# Patient Record
Sex: Female | Born: 1980 | Hispanic: No | Marital: Married | Smoking: Never smoker
Health system: Southern US, Community
[De-identification: ages and names within clinical notes are randomized; demographics above are authoritative.]

## PROBLEM LIST (undated history)

## (undated) DIAGNOSIS — N90810 Female genital mutilation status, unspecified: Secondary | ICD-10-CM

## (undated) DIAGNOSIS — E282 Polycystic ovarian syndrome: Secondary | ICD-10-CM

## (undated) DIAGNOSIS — Z789 Other specified health status: Secondary | ICD-10-CM

## (undated) HISTORY — DX: Female genital mutilation status, unspecified: N90.810

## (undated) HISTORY — PX: CIRCUMCISION: SUR203

## (undated) HISTORY — PX: NO PAST SURGERIES: SHX2092

---

## 2006-06-10 ENCOUNTER — Ambulatory Visit: Payer: Self-pay | Admitting: Internal Medicine

## 2006-07-02 ENCOUNTER — Ambulatory Visit: Payer: Self-pay | Admitting: Family Medicine

## 2006-08-15 ENCOUNTER — Ambulatory Visit: Payer: Self-pay | Admitting: Internal Medicine

## 2006-09-02 ENCOUNTER — Encounter (INDEPENDENT_AMBULATORY_CARE_PROVIDER_SITE_OTHER): Payer: Self-pay | Admitting: Nurse Practitioner

## 2006-09-02 ENCOUNTER — Ambulatory Visit: Payer: Self-pay | Admitting: Internal Medicine

## 2007-01-19 ENCOUNTER — Inpatient Hospital Stay (HOSPITAL_COMMUNITY): Admission: AD | Admit: 2007-01-19 | Discharge: 2007-01-20 | Payer: Self-pay | Admitting: Obstetrics & Gynecology

## 2007-03-11 ENCOUNTER — Ambulatory Visit (HOSPITAL_COMMUNITY): Admission: RE | Admit: 2007-03-11 | Discharge: 2007-03-11 | Payer: Self-pay | Admitting: Obstetrics & Gynecology

## 2007-07-09 ENCOUNTER — Ambulatory Visit: Payer: Self-pay | Admitting: Advanced Practice Midwife

## 2007-07-09 ENCOUNTER — Inpatient Hospital Stay (HOSPITAL_COMMUNITY): Admission: AD | Admit: 2007-07-09 | Discharge: 2007-07-09 | Payer: Self-pay | Admitting: Obstetrics & Gynecology

## 2007-08-04 ENCOUNTER — Ambulatory Visit: Payer: Self-pay | Admitting: *Deleted

## 2007-08-04 ENCOUNTER — Inpatient Hospital Stay (HOSPITAL_COMMUNITY): Admission: AD | Admit: 2007-08-04 | Discharge: 2007-08-05 | Payer: Self-pay | Admitting: Family Medicine

## 2007-08-14 ENCOUNTER — Ambulatory Visit: Payer: Self-pay | Admitting: Obstetrics and Gynecology

## 2007-08-15 ENCOUNTER — Ambulatory Visit: Payer: Self-pay | Admitting: Gynecology

## 2007-08-15 ENCOUNTER — Inpatient Hospital Stay (HOSPITAL_COMMUNITY): Admission: AD | Admit: 2007-08-15 | Discharge: 2007-08-15 | Payer: Self-pay | Admitting: Gynecology

## 2007-08-17 ENCOUNTER — Ambulatory Visit: Payer: Self-pay | Admitting: Obstetrics & Gynecology

## 2007-08-17 ENCOUNTER — Inpatient Hospital Stay (HOSPITAL_COMMUNITY): Admission: AD | Admit: 2007-08-17 | Discharge: 2007-08-20 | Payer: Self-pay | Admitting: Obstetrics & Gynecology

## 2008-01-31 ENCOUNTER — Emergency Department (HOSPITAL_COMMUNITY): Admission: EM | Admit: 2008-01-31 | Discharge: 2008-01-31 | Payer: Self-pay | Admitting: Emergency Medicine

## 2009-10-26 ENCOUNTER — Encounter: Admission: RE | Admit: 2009-10-26 | Discharge: 2009-10-26 | Payer: Self-pay | Admitting: Family Medicine

## 2010-07-27 ENCOUNTER — Inpatient Hospital Stay (HOSPITAL_COMMUNITY): Payer: BC Managed Care – PPO

## 2010-07-27 ENCOUNTER — Inpatient Hospital Stay (HOSPITAL_COMMUNITY)
Admission: AD | Admit: 2010-07-27 | Discharge: 2010-07-27 | Disposition: A | Discharge status: Home / Self Care | Payer: BC Managed Care – PPO | Source: Ambulatory Visit | Attending: Obstetrics and Gynecology | Admitting: Obstetrics and Gynecology

## 2010-07-27 DIAGNOSIS — O211 Hyperemesis gravidarum with metabolic disturbance: Secondary | ICD-10-CM | POA: Insufficient documentation

## 2010-07-27 LAB — URINALYSIS, ROUTINE W REFLEX MICROSCOPIC
Protein, ur: NEGATIVE mg/dL
Urobilinogen, UA: 0.2 mg/dL (ref 0.0–1.0)
pH: 6 (ref 5.0–8.0)

## 2010-07-27 LAB — URINE MICROSCOPIC-ADD ON

## 2010-07-29 LAB — URINE CULTURE: Culture  Setup Time: 201206020409

## 2010-08-21 LAB — ANTIBODY SCREEN: Antibody Screen: NEGATIVE

## 2010-08-21 LAB — SYPHILIS: RPR W/REFLEX TO RPR TITER AND TREPONEMAL ANTIBODIES, TRADITIONAL SCREENING AND DIAGNOSIS ALGORITHM: RPR: NONREACTIVE

## 2010-08-21 LAB — ABO/RH: RH Type: POSITIVE

## 2010-08-21 LAB — HIV ANTIBODY (ROUTINE TESTING W REFLEX): HIV: NONREACTIVE

## 2010-08-21 LAB — HEPATITIS B SURFACE ANTIGEN: Hepatitis B Surface Ag: NEGATIVE

## 2010-09-06 ENCOUNTER — Inpatient Hospital Stay (HOSPITAL_COMMUNITY): Payer: BC Managed Care – PPO

## 2010-09-06 ENCOUNTER — Encounter (HOSPITAL_COMMUNITY): Payer: Self-pay

## 2010-09-06 ENCOUNTER — Inpatient Hospital Stay (HOSPITAL_COMMUNITY)
Admission: AD | Admit: 2010-09-06 | Discharge: 2010-09-07 | Disposition: A | Discharge status: Home / Self Care | Payer: BC Managed Care – PPO | Source: Ambulatory Visit | Attending: Obstetrics and Gynecology | Admitting: Obstetrics and Gynecology

## 2010-09-06 DIAGNOSIS — O209 Hemorrhage in early pregnancy, unspecified: Secondary | ICD-10-CM

## 2010-09-06 DIAGNOSIS — O26859 Spotting complicating pregnancy, unspecified trimester: Secondary | ICD-10-CM | POA: Insufficient documentation

## 2010-09-06 DIAGNOSIS — B3731 Acute candidiasis of vulva and vagina: Secondary | ICD-10-CM

## 2010-09-06 DIAGNOSIS — Z3490 Encounter for supervision of normal pregnancy, unspecified, unspecified trimester: Secondary | ICD-10-CM

## 2010-09-06 DIAGNOSIS — B373 Candidiasis of vulva and vagina: Secondary | ICD-10-CM

## 2010-09-06 DIAGNOSIS — O4690 Antepartum hemorrhage, unspecified, unspecified trimester: Secondary | ICD-10-CM | POA: Diagnosis present

## 2010-09-06 LAB — URINALYSIS, ROUTINE W REFLEX MICROSCOPIC
Glucose, UA: NEGATIVE mg/dL
Protein, ur: NEGATIVE mg/dL
pH: 6 (ref 5.0–8.0)

## 2010-09-06 LAB — URINE MICROSCOPIC-ADD ON

## 2010-09-06 LAB — WET PREP, GENITAL: Clue Cells Wet Prep HPF POC: NONE SEEN

## 2010-09-06 NOTE — ED Provider Notes (Signed)
Sherry Frey is a 30 y.o. female at 13.[redacted] week gestation presenting for first trimester spotting since this afternoon and exacerbation of preexisting LBP radiating down the R leg. She was seen at [facility] two days ago and was Dx with VVC Tx by Terazol 7. She also has a first trimester screen [place] w/ no abnormalities noted. Last IC 5 days ago. History OB History    Grav Para Term Preterm Abortions TAB SAB Ect Mult Living   2 1 1       1      History reviewed. No pertinent past medical history. Past Surgical History  Procedure Date  . Circumcision     female circumcision @ age 81 yrs old   Family History: family history is not on file. Social History:  reports that she has never smoked. She does not have any smokeless tobacco history on file. She reports that she does not drink alcohol or use illicit drugs.  ROS    Blood pressure 100/62, pulse 80, temperature 99 F (37.2 C), temperature source Oral, resp. rate 16, height 6' 4.25" (1.937 m), weight 89.869 kg (198 lb 2 oz). Exam Physical Exam  Prenatal labs: ABO, Rh:  A + Antibody:  neg Rubella:  Immune RPR:   NR HBsAg:   neg HIV:   NR GBS:     Assessment/Plan: 1 13.1 week IUP 2. First trimester spotting of unknown etiology, stable  Plan: 1. Wet Prep, GC/CT 2. OB Ultrasound  < 14 weeks   SMITH,[place] 09/06/2010, 7:26 PM

## 2010-09-06 NOTE — Progress Notes (Signed)
Pt states vaginal bleeding began today at 1600, having lower back pain only, rates 10/10. Denies vag d/c changes, blood is dark and mucusy.

## 2010-09-06 NOTE — Progress Notes (Signed)
"  Around 1600 I was riding with my husband and started bleeding.  I had been having the lower back pain and RT leg pain since this morning.  I am having no abd pain at this time."

## 2010-09-07 NOTE — ED Provider Notes (Signed)
No new VB.  Wet prep, genital       Yeast, Wet Prep MODERATE    Trich, Wet Prep NONE SEEN   Clue Cells, Wet Prep NONE SEEN    WBC, Wet Prep MODERATE BACTERIA SEEN        *RADIOLOGY REPORT*   Clinical Data: Vaginal bleeding.  Pregnancy.   OBSTETRIC <14 WK ULTRASOUND   Technique:  Transabdominal ultrasound was performed for evaluation of the gestation as well as the maternal uterus and adnexal regions.   Comparison:  None.   Intrauterine gestational sac: Single intrauterine gestational sac. Yolk sac: Not visualized. Embryo: Present. Cardiac Activity: Present. Heart Rate: 147 bpm   MSD:  mm  w  d CRL:  75 mm  13w  5d          [place] EDC: 03/09/2011   Maternal uterus/Adnexae: Physiologic appearance of the uterus and adnexa.  No subchorionic hemorrhage or free fluid.   IMPRESSION: Single viable intrauterine pregnancy with estimated gestational age [redacted] weeks 5 days.  No complicating features.   Original Report Authenticated By: Andreas Newport, M.D.     Imaging     US OB Comp Less 14 Wks (Order #[id number]) on 09/06/2010 - Imaging Information       Signed Date/Time   Phone Pager    Wynn Banker 09/06/2010  9:14 PM EDT [phone]     Assessment: 1. First trimester bleeding secondary to yeast infection (on day #2 of Tx), stable  Plan: 1/ D/C home 2 F/U AS at [facility] 3. Pelvic rest x 1 week 4. Complete course of Terazol 7 5. SAB precautions  Dorathy Kinsman, CNM

## 2010-09-09 LAB — GC/CHLAMYDIA PROBE AMP, GENITAL: GC Probe Amp, Genital: NEGATIVE

## 2010-11-22 LAB — CBC
HCT: 32.2 — ABNORMAL LOW
Hemoglobin: 11.1 — ABNORMAL LOW
Hemoglobin: 12.8
MCHC: 34.3
MCHC: 34.1
MCV: 82
Platelets: 277
RBC: 3.93
RBC: 4.6
RDW: 13.9
WBC: 6.2
WBC: 6.3

## 2010-11-22 LAB — COMPREHENSIVE METABOLIC PANEL
ALT: 13
Alkaline Phosphatase: 160 — ABNORMAL HIGH
Chloride: 108
Sodium: 135

## 2010-11-22 LAB — URINALYSIS, ROUTINE W REFLEX MICROSCOPIC: pH: 6

## 2010-11-30 LAB — DIFFERENTIAL
Basophils Relative: 1 % (ref 0–1)
Eosinophils Absolute: 0.1 10*3/uL (ref 0.0–0.7)
Lymphs Abs: 2.3 10*3/uL (ref 0.7–4.0)
Monocytes Absolute: 0.8 10*3/uL (ref 0.1–1.0)
Monocytes Relative: 13 % — ABNORMAL HIGH (ref 3–12)
Neutrophils Relative %: 47 % (ref 43–77)

## 2010-11-30 LAB — POCT I-STAT, CHEM 8
Calcium, Ion: 1.2 mmol/L (ref 1.12–1.32)
Creatinine, Ser: 0.6 mg/dL (ref 0.4–1.2)
Glucose, Bld: 73 mg/dL (ref 70–99)
HCT: 42 % (ref 36.0–46.0)
Hemoglobin: 14.3 g/dL (ref 12.0–15.0)
Potassium: 3.9 mEq/L (ref 3.5–5.1)

## 2010-11-30 LAB — URINE MICROSCOPIC-ADD ON

## 2010-11-30 LAB — URINALYSIS, ROUTINE W REFLEX MICROSCOPIC
Glucose, UA: NEGATIVE mg/dL
Leukocytes, UA: NEGATIVE
pH: 5.5 (ref 5.0–8.0)

## 2010-11-30 LAB — POCT PREGNANCY, URINE: Preg Test, Ur: NEGATIVE

## 2010-11-30 LAB — CBC: RDW: 13.2 % (ref 11.5–15.5)

## 2010-12-04 LAB — WET PREP, GENITAL
Clue Cells Wet Prep HPF POC: NONE SEEN
Trich, Wet Prep: NONE SEEN

## 2010-12-04 LAB — URINALYSIS, ROUTINE W REFLEX MICROSCOPIC
Bilirubin Urine: NEGATIVE
Hgb urine dipstick: NEGATIVE
Ketones, ur: NEGATIVE
Specific Gravity, Urine: 1.02
Urobilinogen, UA: 0.2
pH: 6

## 2010-12-04 LAB — DIFFERENTIAL
Basophils Absolute: 0
Basophils Relative: 0
Lymphocytes Relative: 42
Neutro Abs: 3.4
Neutrophils Relative %: 48

## 2010-12-04 LAB — CBC
MCHC: 34.7
RDW: 13.6

## 2010-12-04 LAB — GC/CHLAMYDIA PROBE AMP, GENITAL
Chlamydia, DNA Probe: NEGATIVE
GC Probe Amp, Genital: NEGATIVE

## 2011-02-26 NOTE — L&D Delivery Note (Signed)
Delivery Note  Called by RN to come for delivery, upon putting pt in stirrups vertex was crowning spontaneously, FHR variables were noted, overall reassuring At 4:35 AM a viable female was delivered via Vaginal, Spontaneous Delivery (Presentation: Left Occiput Anterior). Loose nuchal cord reduced x2, shoulders delivered easily  APGAR: 9, 9; weight 7 lb 9.3 oz (3440 g).   Placenta status: Intact, Retained, upon delivery of placenta, clots expelled, bimanual exploration revealed possible placenta retained, attempted manual removal, Fundus remained firm and was 2-3 above U and deviated to R side. Attempted to repair shallow vaginal laceration, tissue very friable. Called Dr. Estanislado Pandy to come evaluate for possible retained placenta, vitals remained stable, bleeding was stable, foley was in place and draining, epidural working well,  See Dr. Cloretta Ned note Pathology.  Cord: 3 vessels with the following complications: Manual removal/retained placenta    Anesthesia: Epidural  Episiotomy: None Lacerations: 1st degree;Perineal Suture Repair: 3.0 monocryl Est. Blood Loss (mL): 300  Mom to postpartum.  Baby to nursery-stable. Plans to Breast and bottle feed   Baylyn Sickles M 03/05/2011, 6:28 AM

## 2011-03-04 ENCOUNTER — Inpatient Hospital Stay (HOSPITAL_COMMUNITY)
Admission: AD | Admit: 2011-03-04 | Discharge: 2011-03-07 | DRG: 372 | Disposition: A | Discharge status: Home / Self Care | Payer: BC Managed Care – PPO | Source: Ambulatory Visit | Attending: Obstetrics and Gynecology | Admitting: Obstetrics and Gynecology

## 2011-03-04 ENCOUNTER — Inpatient Hospital Stay (HOSPITAL_COMMUNITY): Payer: BC Managed Care – PPO | Admitting: Anesthesiology

## 2011-03-04 ENCOUNTER — Encounter (HOSPITAL_COMMUNITY): Payer: Self-pay | Admitting: *Deleted

## 2011-03-04 ENCOUNTER — Encounter (HOSPITAL_COMMUNITY): Payer: Self-pay | Admitting: Anesthesiology

## 2011-03-04 DIAGNOSIS — N979 Female infertility, unspecified: Secondary | ICD-10-CM

## 2011-03-04 DIAGNOSIS — O4290 Premature rupture of membranes, unspecified as to length of time between rupture and onset of labor, unspecified weeks of gestation: Secondary | ICD-10-CM | POA: Diagnosis present

## 2011-03-04 DIAGNOSIS — E282 Polycystic ovarian syndrome: Secondary | ICD-10-CM | POA: Diagnosis present

## 2011-03-04 DIAGNOSIS — D649 Anemia, unspecified: Secondary | ICD-10-CM | POA: Diagnosis present

## 2011-03-04 DIAGNOSIS — IMO0002 Reserved for concepts with insufficient information to code with codable children: Secondary | ICD-10-CM

## 2011-03-04 DIAGNOSIS — N90810 Female genital mutilation status, unspecified: Secondary | ICD-10-CM | POA: Diagnosis present

## 2011-03-04 DIAGNOSIS — O4690 Antepartum hemorrhage, unspecified, unspecified trimester: Secondary | ICD-10-CM

## 2011-03-04 DIAGNOSIS — O9903 Anemia complicating the puerperium: Secondary | ICD-10-CM | POA: Diagnosis not present

## 2011-03-04 HISTORY — DX: Other specified health status: Z78.9

## 2011-03-04 LAB — CBC
MCH: 26.2 pg (ref 26.0–34.0)
MCHC: 32.5 g/dL (ref 30.0–36.0)
Platelets: 267 10*3/uL (ref 150–400)

## 2011-03-04 LAB — POCT FERN TEST: Fern Test: POSITIVE

## 2011-03-04 MED ORDER — LACTATED RINGERS IV SOLN
INTRAVENOUS | Status: DC
  Administered 2011-03-04: 22:00:00 via INTRAVENOUS

## 2011-03-04 MED ORDER — OXYTOCIN 20 UNITS IN LACTATED RINGERS INFUSION - SIMPLE
1.0000 m[IU]/min | INTRAVENOUS | Status: DC
  Administered 2011-03-04: 1 m[IU]/min via INTRAVENOUS
  Filled 2011-03-04: qty 1000

## 2011-03-04 MED ORDER — OXYTOCIN BOLUS FROM INFUSION
500.0000 mL | Freq: Once | INTRAVENOUS | Status: AC
  Administered 2011-03-05: 500 mL via INTRAVENOUS
  Filled 2011-03-04: qty 500
  Filled 2011-03-04: qty 1000

## 2011-03-04 MED ORDER — IBUPROFEN 600 MG PO TABS: 600.0000 mg | ORAL_TABLET | Freq: Four times a day (QID) | ORAL | Status: DC | PRN

## 2011-03-04 MED ORDER — OXYCODONE-ACETAMINOPHEN 5-325 MG PO TABS: 2.0000 | ORAL_TABLET | ORAL | Status: DC | PRN

## 2011-03-04 MED ORDER — EPHEDRINE 5 MG/ML INJ: 10.0000 mg | INTRAVENOUS | Status: DC | PRN

## 2011-03-04 MED ORDER — DIPHENHYDRAMINE HCL 50 MG/ML IJ SOLN: 12.5000 mg | INTRAMUSCULAR | Status: DC | PRN

## 2011-03-04 MED ORDER — EPHEDRINE 5 MG/ML INJ
10.0000 mg | INTRAVENOUS | Status: DC | PRN
  Filled 2011-03-04: qty 4

## 2011-03-04 MED ORDER — LIDOCAINE HCL (PF) 1 % IJ SOLN
30.0000 mL | INTRAMUSCULAR | Status: DC | PRN
  Filled 2011-03-04: qty 30

## 2011-03-04 MED ORDER — PHENYLEPHRINE 40 MCG/ML (10ML) SYRINGE FOR IV PUSH (FOR BLOOD PRESSURE SUPPORT): 80.0000 ug | PREFILLED_SYRINGE | INTRAVENOUS | Status: DC | PRN

## 2011-03-04 MED ORDER — TERBUTALINE SULFATE 1 MG/ML IJ SOLN: 0.2500 mg | Freq: Once | INTRAMUSCULAR | Status: AC | PRN

## 2011-03-04 MED ORDER — BUTORPHANOL TARTRATE 2 MG/ML IJ SOLN: 2.0000 mg | INTRAMUSCULAR | Status: DC | PRN

## 2011-03-04 MED ORDER — ACETAMINOPHEN 325 MG PO TABS: 650.0000 mg | ORAL_TABLET | ORAL | Status: DC | PRN

## 2011-03-04 MED ORDER — FENTANYL 2.5 MCG/ML BUPIVACAINE 1/10 % EPIDURAL INFUSION (WH - ANES)
14.0000 mL/h | INTRAMUSCULAR | Status: DC
  Administered 2011-03-05 (×2): 14 mL/h via EPIDURAL
  Filled 2011-03-04 (×2): qty 60

## 2011-03-04 MED ORDER — OXYTOCIN 20 UNITS IN LACTATED RINGERS INFUSION - SIMPLE: 125.0000 mL/h | Freq: Once | INTRAVENOUS | Status: DC

## 2011-03-04 MED ORDER — CITRIC ACID-SODIUM CITRATE 334-500 MG/5ML PO SOLN: 30.0000 mL | ORAL | Status: DC | PRN

## 2011-03-04 MED ORDER — PHENYLEPHRINE 40 MCG/ML (10ML) SYRINGE FOR IV PUSH (FOR BLOOD PRESSURE SUPPORT)
80.0000 ug | PREFILLED_SYRINGE | INTRAVENOUS | Status: DC | PRN
  Filled 2011-03-04: qty 5

## 2011-03-04 MED ORDER — LACTATED RINGERS IV SOLN: 500.0000 mL | Freq: Once | INTRAVENOUS | Status: DC

## 2011-03-04 MED ORDER — ONDANSETRON HCL 4 MG/2ML IJ SOLN: 4.0000 mg | Freq: Four times a day (QID) | INTRAMUSCULAR | Status: DC | PRN

## 2011-03-04 MED ORDER — LACTATED RINGERS IV SOLN: 500.0000 mL | INTRAVENOUS | Status: DC | PRN

## 2011-03-04 MED ORDER — OXYTOCIN 10 UNIT/ML IJ SOLN: 10.0000 [IU] | Freq: Once | INTRAMUSCULAR | Status: DC

## 2011-03-04 NOTE — Progress Notes (Signed)
Pt in for lower back pain that has progressively gotten worse since yesterday.  Reports increased pelvic pressure since yesterday.  Reports a watery discharge, states its been going on x3 weeks but heavier today.  Denies any bleeding.  + FM.

## 2011-03-04 NOTE — Anesthesia Procedure Notes (Signed)
Epidural Patient location during procedure: OB Start time: 03/04/2011 11:52 PM Reason for block: procedure for pain  Staffing Performed by: anesthesiologist   Preanesthetic Checklist Completed: patient identified, site marked, surgical consent, pre-op evaluation, timeout performed, IV checked, risks and benefits discussed and monitors and equipment checked  Epidural Patient position: sitting Prep: site prepped and draped and DuraPrep Patient monitoring: continuous pulse ox and blood pressure Approach: midline Injection technique: LOR air  Needle:  Needle type: Tuohy  Needle gauge: 17 G Needle length: 9 cm Needle insertion depth: 5 cm cm Catheter type: closed end flexible Catheter size: 19 Gauge Catheter at skin depth: 10 cm Test dose: negative  Assessment Events: blood not aspirated, injection not painful, no injection resistance, negative IV test and no paresthesia  Additional Notes Discussed risk of headache, infection, bleeding, nerve injury and failed or incomplete block.  Patient voices understanding and wishes to proceed.

## 2011-03-04 NOTE — Anesthesia Preprocedure Evaluation (Signed)
Anesthesia Evaluation  Patient identified by MRN, date of birth, ID band Patient awake    Reviewed: Allergy & Precautions, H&P , NPO status , Patient's Chart, lab work & pertinent test results, reviewed documented beta blocker date and time   History of Anesthesia Complications Negative for: history of anesthetic complications  Airway Mallampati: II TM Distance: >3 FB Neck ROM: full    Dental  (+) Teeth Intact   Pulmonary neg pulmonary ROS,  clear to auscultation        Cardiovascular neg cardio ROS regular Normal    Neuro/Psych Negative Neurological ROS  Negative Psych ROS   GI/Hepatic negative GI ROS, Neg liver ROS,   Endo/Other  Negative Endocrine ROS  Renal/GU negative Renal ROS  Genitourinary negative   Musculoskeletal   Abdominal   Peds  Hematology negative hematology ROS (+)   Anesthesia Other Findings   Reproductive/Obstetrics (+) Pregnancy                           Anesthesia Physical Anesthesia Plan  ASA: II  Anesthesia Plan: Epidural   Post-op Pain Management:    Induction:   Airway Management Planned:   Additional Equipment:   Intra-op Plan:   Post-operative Plan:   Informed Consent: I have reviewed the patients History and Physical, chart, labs and discussed the procedure including the risks, benefits and alternatives for the proposed anesthesia with the patient or authorized representative who has indicated his/her understanding and acceptance.     Plan Discussed with:   Anesthesia Plan Comments:         Anesthesia Quick Evaluation  

## 2011-03-05 ENCOUNTER — Encounter (HOSPITAL_COMMUNITY): Payer: Self-pay | Admitting: *Deleted

## 2011-03-05 ENCOUNTER — Other Ambulatory Visit: Payer: Self-pay | Admitting: Obstetrics and Gynecology

## 2011-03-05 DIAGNOSIS — N979 Female infertility, unspecified: Secondary | ICD-10-CM

## 2011-03-05 DIAGNOSIS — E282 Polycystic ovarian syndrome: Secondary | ICD-10-CM | POA: Diagnosis present

## 2011-03-05 LAB — CBC
HCT: 28.9 % — ABNORMAL LOW (ref 36.0–46.0)
Hemoglobin: 9.5 g/dL — ABNORMAL LOW (ref 12.0–15.0)
MCH: 26.8 pg (ref 26.0–34.0)
MCHC: 32.9 g/dL (ref 30.0–36.0)
MCV: 81.4 fL (ref 78.0–100.0)

## 2011-03-05 LAB — RPR: RPR Ser Ql: NONREACTIVE

## 2011-03-05 MED ORDER — ONDANSETRON HCL 4 MG PO TABS: 4.0000 mg | ORAL_TABLET | ORAL | Status: DC | PRN

## 2011-03-05 MED ORDER — SIMETHICONE 80 MG PO CHEW
80.0000 mg | CHEWABLE_TABLET | ORAL | Status: DC | PRN
  Administered 2011-03-07: 80 mg via ORAL

## 2011-03-05 MED ORDER — METHYLERGONOVINE MALEATE 0.2 MG PO TABS
0.2000 mg | ORAL_TABLET | ORAL | Status: DC
  Administered 2011-03-05 – 2011-03-06 (×6): 0.2 mg via ORAL
  Filled 2011-03-05 (×6): qty 1

## 2011-03-05 MED ORDER — IBUPROFEN 600 MG PO TABS
600.0000 mg | ORAL_TABLET | Freq: Four times a day (QID) | ORAL | Status: DC
  Administered 2011-03-05 – 2011-03-07 (×8): 600 mg via ORAL
  Filled 2011-03-05 (×8): qty 1

## 2011-03-05 MED ORDER — DIPHENHYDRAMINE HCL 25 MG PO CAPS
25.0000 mg | ORAL_CAPSULE | Freq: Once | ORAL | Status: AC
  Administered 2011-03-05: 25 mg via ORAL
  Filled 2011-03-05: qty 1

## 2011-03-05 MED ORDER — WITCH HAZEL-GLYCERIN EX PADS
1.0000 "application " | MEDICATED_PAD | CUTANEOUS | Status: DC | PRN
  Administered 2011-03-06: 1 via TOPICAL

## 2011-03-05 MED ORDER — POLYSACCHARIDE IRON 150 MG PO CAPS
150.0000 mg | ORAL_CAPSULE | Freq: Two times a day (BID) | ORAL | Status: DC
  Administered 2011-03-05 (×2): 150 mg via ORAL
  Filled 2011-03-05 (×3): qty 1

## 2011-03-05 MED ORDER — LANOLIN HYDROUS EX OINT: TOPICAL_OINTMENT | CUTANEOUS | Status: DC | PRN

## 2011-03-05 MED ORDER — ONDANSETRON HCL 4 MG/2ML IJ SOLN: 4.0000 mg | INTRAMUSCULAR | Status: DC | PRN

## 2011-03-05 MED ORDER — MEASLES, MUMPS & RUBELLA VAC ~~LOC~~ INJ
0.5000 mL | INJECTION | Freq: Once | SUBCUTANEOUS | Status: DC
  Filled 2011-03-05: qty 0.5

## 2011-03-05 MED ORDER — DIBUCAINE 1 % RE OINT
1.0000 "application " | TOPICAL_OINTMENT | RECTAL | Status: DC | PRN
  Administered 2011-03-06: 1 via RECTAL
  Filled 2011-03-05: qty 28

## 2011-03-05 MED ORDER — DIPHENHYDRAMINE HCL 25 MG PO CAPS: 25.0000 mg | ORAL_CAPSULE | Freq: Four times a day (QID) | ORAL | Status: DC | PRN

## 2011-03-05 MED ORDER — MISOPROSTOL 200 MCG PO TABS
ORAL_TABLET | ORAL | Status: AC
  Filled 2011-03-05: qty 5

## 2011-03-05 MED ORDER — BENZOCAINE-MENTHOL 20-0.5 % EX AERO
INHALATION_SPRAY | CUTANEOUS | Status: AC
  Filled 2011-03-05: qty 56

## 2011-03-05 MED ORDER — TETANUS-DIPHTH-ACELL PERTUSSIS 5-2.5-18.5 LF-MCG/0.5 IM SUSP
0.5000 mL | Freq: Once | INTRAMUSCULAR | Status: AC
  Administered 2011-03-06: 0.5 mL via INTRAMUSCULAR
  Filled 2011-03-05: qty 0.5

## 2011-03-05 MED ORDER — DTAP-HEPATITIS B RECOMB-IPV IM SUSP: 0.5000 mL | Freq: Once | INTRAMUSCULAR | Status: DC

## 2011-03-05 MED ORDER — BENZOCAINE-MENTHOL 20-0.5 % EX AERO
1.0000 "application " | INHALATION_SPRAY | CUTANEOUS | Status: DC | PRN
  Administered 2011-03-05: 1 via TOPICAL

## 2011-03-05 MED ORDER — ZOLPIDEM TARTRATE 5 MG PO TABS: 5.0000 mg | ORAL_TABLET | Freq: Every evening | ORAL | Status: DC | PRN

## 2011-03-05 MED ORDER — OXYCODONE-ACETAMINOPHEN 5-325 MG PO TABS
1.0000 | ORAL_TABLET | ORAL | Status: DC | PRN
  Administered 2011-03-05 – 2011-03-07 (×4): 1 via ORAL
  Filled 2011-03-05 (×4): qty 1

## 2011-03-05 MED ORDER — PRENATAL MULTIVITAMIN CH
1.0000 | ORAL_TABLET | Freq: Every day | ORAL | Status: DC
  Administered 2011-03-06 – 2011-03-07 (×2): 1 via ORAL
  Filled 2011-03-05 (×2): qty 1

## 2011-03-05 MED ORDER — LIDOCAINE HCL 1.5 % IJ SOLN
INTRAMUSCULAR | Status: DC | PRN
  Administered 2011-03-04: 4 mL via INTRADERMAL
  Administered 2011-03-04: 3 mL via INTRADERMAL
  Administered 2011-03-05: 4 mL via INTRADERMAL

## 2011-03-05 MED ORDER — SENNOSIDES-DOCUSATE SODIUM 8.6-50 MG PO TABS
2.0000 | ORAL_TABLET | Freq: Every day | ORAL | Status: DC
  Administered 2011-03-05 – 2011-03-06 (×2): 2 via ORAL

## 2011-03-05 NOTE — Anesthesia Postprocedure Evaluation (Signed)
Anesthesia Post Note  Patient: Sherry Frey  Procedure(s) Performed: * No procedures listed *  Anesthesia type: Epidural  Patient location: Mother/Baby  Post pain: Pain level controlled  Post assessment: Post-op Vital signs reviewed  Last Vitals:  Filed Vitals:   03/05/11 1104  BP: 99/67  Pulse: 87  Temp:   Resp: 18    Post vital signs: Reviewed  Level of consciousness: awake  Complications: No apparent anesthesia complications

## 2011-03-05 NOTE — Progress Notes (Signed)
Called to evaluate possible retained placenta. SVD of girl without complications. Placenta spontaneously expelled. No excessive bleeding. Upon my arrival, patient calm and comfortable under epidural. Placenta examined and appeared complete. Manual inspection of uterine cavity returns a few clots but o/w unremarkable. Uterus firm 0/0 slightly deviated to left. Superficial vaginal laceration repaired with 3-0 Monocryl. Pt informed that we will monitor closely.
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
## 2011-03-05 NOTE — Progress Notes (Signed)
Patient ID: Sherry Frey, female   DOB: 05-28-1980, 31 y.o.   MRN: [id number] .Subjective: Feels some pressure, but denies ctx pain   Objective: BP 82/44  Pulse 79  Temp(Src) 97.9 F (36.6 C) (Oral)  Resp 20  Ht 5\' 5"  (1.651 m)  Wt 87.998 kg (194 lb)  BMI 32.28 kg/m2  LMP 06/06/2010  Breastfeeding? Unknown   FHT:  FHR: 110 bpm, variability: moderate,  accelerations:  Present,  decelerations:  Present early variables UC:   regular, every 2-3 minutes - MVU about 220 SVE:   Dilation: 6.5 Effacement (%): 100 Station: -2 Exam by:: Isac Sarna, RN, Bonnielee Haff, RN  Pitocin at 3mu  Assessment / Plan: Augmentation of labor, progressing well GBS neg  Fetal Wellbeing:  Category I Pain Control:  Epidural  Recheck about 2 hours or PRN  Update physician PRN  Fantasy Donald M 03/05/2011, 4:02 AM

## 2011-03-05 NOTE — Progress Notes (Signed)
Addendum to delivery note.   THere were significant trailing membranes as placenta delivered, removed with kelly clamp.  Placenta was sent to path for eval.  S.Kinser Fellman, CNM

## 2011-03-05 NOTE — Progress Notes (Signed)
Patient ID: Sherry Frey, female   DOB: 10/30/1980, 31 y.o.   MRN: [id number] .Subjective: Comfortable now with epidural   Objective: BP 82/49  Pulse 108  Temp(Src) 97.6 F (36.4 C) (Oral)  Resp 18  Ht 5\' 5"  (1.651 m)  Wt 87.998 kg (194 lb)  BMI 32.28 kg/m2  LMP 06/06/2010  Breastfeeding? Unknown   FHT:  FHR: 110 bpm, variability: moderate,  accelerations:  Present,  decelerations:  Absent UC:   regular, every 2-3 minutes SVE:   Dilation: 4 Effacement (%): 60 Station: -2 Exam by:: S. Aara Jacquot, CNM  Pitocin at 4mu  Assessment / Plan: IOL secondary to PROM, minimal cervical change GBS neg IUPC placed initial large bloody show, replaced with 2nd IUPC without difficulty  Will augment pitocin for adequate MVU's   Fetal Wellbeing:  Category I Pain Control:  Epidural  Update physician PRN  Arzella Rehmann M 03/05/2011, 1:00 AM

## 2011-03-05 NOTE — Progress Notes (Signed)
Post Partum Day 0 Subjective: Called to assess pt's VB around 0845, but unable to get to room until after 1000.  Pt's fundus 4/u, but bleeding stable.  Pt had c/o dizziness and nausea earlier, but none now.  Female visitor at bedside.  Nurse and I assisted pt to BR and ambulated okay w/o dizziness.  Able to void.  Newborn was initially in nursery on my arrival to bedside, but returned to pt's room while I was there.  Objective: Blood pressure 99/67, pulse 87, temperature 98.2 F (36.8 C), temperature source Oral, resp. rate 18, height 5\' 5"  (1.651 m), weight 87.998 kg (194 lb), last menstrual period 06/06/2010, unknown if currently breastfeeding. .. Filed Vitals:   03/05/11 0900 03/05/11 1100 03/05/11 1102 03/05/11 1104  BP: 86/61 98/66 108/60 99/67  Pulse: 79 109 139 87  Temp: 98.2 F (36.8 C)     TempSrc: Oral     Resp: 18 20 20 18   Height:      Weight:       Physical Exam:  General: alert, cooperative and no distress Lochia: appropriate Uterine Fundus: firm, but 4/U Incision: n/a  Basename 03/05/11 1100 03/04/11 2216  HGB 9.5* 10.6*  HCT 28.9* 32.6*    Assessment/Plan: 1.  PPD#0--questionable retained products post-delivery w/ trailing membranes but normal bimanual exam by [facility] and Dr. Estanislado Pandy 2.  Moderate to heavy VB since trx to M/B, but stable 3.  Fundus above umbilicus--question if bladder retention or fibroid  4.  Orthostatic between lying and sitting  5.  Up OOB w/ assistance and tolerated well x1 this AM 6.  HGB only decreased so far by 1 point, but started on Nu-iron bid and will recheck CBC tomorrow or prn 7.  Cont current POC and close monitoring  LOS: 1 day   Delphin Funes H 03/05/2011, 12:50 PM

## 2011-03-06 LAB — CBC
MCH: 27.2 pg (ref 26.0–34.0)
MCHC: 33.1 g/dL (ref 30.0–36.0)
MCV: 82.1 fL (ref 78.0–100.0)
Platelets: 230 10*3/uL (ref 150–400)
RBC: 3.46 MIL/uL — ABNORMAL LOW (ref 3.87–5.11)

## 2011-03-06 MED ORDER — POLYSACCHARIDE IRON 150 MG PO CAPS
150.0000 mg | ORAL_CAPSULE | Freq: Every day | ORAL | Status: DC
  Administered 2011-03-06 – 2011-03-07 (×2): 150 mg via ORAL
  Filled 2011-03-06 (×3): qty 1

## 2011-03-06 NOTE — Progress Notes (Addendum)
Post Partum Day 1 Subjective: no complaints.  Up ad lib, no syncope or dizziness.  On Methergine course po.  Plans breast and bottlefeeding.  Objective: Blood pressure 94/69, pulse 121, temperature 98.2 F (36.8 C), temperature source Oral, resp. rate 20, height 5\' 5"  (1.651 m), weight 87.998 kg (194 lb), last menstrual period 06/06/2010, SpO2 100.00%, unknown if currently breastfeeding.  Filed Vitals:   03/05/11 1104 03/05/11 1400 03/05/11 2103 03/06/11 0506  BP: 99/67 93/64 93/60  94/69  Pulse: 87 85 96 121  Temp:  98.2 F (36.8 C) 98.3 F (36.8 C) 98.2 F (36.8 C)  TempSrc:  Oral Oral Oral  Resp: 18 20 18 20   Height:      Weight:      SpO2:  98% 100%     Physical Exam:  General: alert Lochia: appropriate Uterine Fundus: firm, 2 below umbilicus, no clots expressed Incision: healing well DVT Evaluation: No evidence of DVT seen on physical exam. Negative Homan's sign.   Basename 03/06/11 0540 03/05/11 1100  HGB 9.4* 9.5*  HCT 28.4* 28.9*    Assessment/Plan: Anemia, but hemodynamic stability Mild tachycardia with exertion, but patient tolerating without difficulty  Check orthostatics today FeSO4 Plan for discharge tomorrow    LOS: 2 days   Estha Few 03/06/2011, 8:30 AM

## 2011-03-07 DIAGNOSIS — D649 Anemia, unspecified: Secondary | ICD-10-CM | POA: Diagnosis present

## 2011-03-07 MED ORDER — IBUPROFEN 600 MG PO TABS: 600.0000 mg | ORAL_TABLET | Freq: Four times a day (QID) | ORAL | Status: AC | PRN

## 2011-03-07 MED ORDER — INFLUENZA VIRUS VACC SPLIT PF IM SUSP
0.2500 mL | INTRAMUSCULAR | Status: DC
  Filled 2011-03-07: qty 0.25

## 2011-03-07 MED ORDER — OXYCODONE-ACETAMINOPHEN 5-325 MG PO TABS: 1.0000 | ORAL_TABLET | ORAL | Status: AC | PRN

## 2011-03-07 NOTE — Discharge Summary (Signed)
  Obstetric Discharge Summary Reason for Admission: onset of labor and rupture of membranes Prenatal Procedures: NST and ultrasound Intrapartum Procedures: spontaneous vaginal delivery and uterine exploration Postpartum Procedures: none Complications-Operative and Postpartum: 1st degree perineal laceration  Temp:  [97.7 F (36.5 C)-98.4 F (36.9 C)] 98.4 F (36.9 C) (01/10 0535) Pulse Rate:  [77-85] 85  (01/10 0535) Resp:  [20] 20  (01/10 0535) BP: (90-107)/(60-69) 95/63 mmHg (01/10 0535) SpO2:  [100 %] 100 % (01/10 0046) Hemoglobin  Date Value Range Status  03/06/2011 9.4* 12.0-15.0 (g/dL) Final     HCT  Date Value Range Status  03/06/2011 28.4* 36.0-46.0 (%) Final    Hospital Course:  Hospital Course: Admitted in labor 03/04/11 with SROM. Negative GBS. Progressed to fully dilated with augmentation. Delivery was performed by Sanda Klein, CNM, without difficulty, but she was unsure if all placental fragments had been removed.  Manual exploration was performed, and Dr. Estanislado Pandy was consulted, with Dr. Estanislado Pandy performing an examination as well, with no retained fragments noted.  Patient and baby tolerated the procedure without difficulty, with a 1st degree perineal laceration noted. Infant to FTN. Mother and infant then had an uncomplicated postpartum course, with breast and bottle feeding going well. Mom's physical exam was WNL, and she was discharged home in stable condition. Contraception plan was undecided at time of discharge.  She received adequate benefit from po pain medications, and was using both Motrin and Percocet.  She will continue her iron supplement after discharge.  Discharge Diagnoses: Term Pregnancy-delivered and anemia, stable  Discharge Information: Date: 03/07/2011 Activity: Per [facility] handout Diet: routine Medications:  Medication List  As of 03/07/2011  8:56 AM   START taking these medications         ibuprofen 600 MG tablet   Commonly known as: ADVIL,MOTRIN   Take  1 tablet (600 mg total) by mouth every 6 (six) hours as needed for pain.      oxyCODONE-acetaminophen 5-325 MG per tablet   Commonly known as: PERCOCET   Take 1-2 tablets by mouth every 3 (three) hours as needed (moderate - severe pain).         CONTINUE taking these medications         acetaminophen 325 MG tablet   Commonly known as: TYLENOL      IRON PO      ondansetron 8 MG disintegrating tablet   Commonly known as: ZOFRAN-ODT      prenatal vitamin w/FE, FA 27-1 MG Tabs          Where to get your medications    These are the prescriptions that you need to pick up.   You may get these medications from any pharmacy.         ibuprofen 600 MG tablet   oxyCODONE-acetaminophen 5-325 MG per tablet           Condition: stable Instructions: refer to practice specific booklet Discharge to: home Follow-up Information    Follow up with [facility] in 6 weeks. (Call for any questions or concerns)          Newborn Data: Live born  Information for the patient's newborn:  Yamna, Mackel [[id number]]  female ; APGAR 9/9 , ; weight 7+9 ;  Home with mother.  Nigel Bridgeman 03/07/2011, 8:56 AM

## 2011-05-16 ENCOUNTER — Encounter (INDEPENDENT_AMBULATORY_CARE_PROVIDER_SITE_OTHER): Payer: BC Managed Care – PPO | Admitting: Obstetrics and Gynecology

## 2011-05-16 DIAGNOSIS — N912 Amenorrhea, unspecified: Secondary | ICD-10-CM

## 2011-05-16 DIAGNOSIS — Z30017 Encounter for initial prescription of implantable subdermal contraceptive: Secondary | ICD-10-CM

## 2011-05-27 ENCOUNTER — Encounter (INDEPENDENT_AMBULATORY_CARE_PROVIDER_SITE_OTHER): Payer: BC Managed Care – PPO | Admitting: Obstetrics and Gynecology

## 2011-05-27 DIAGNOSIS — Z3040 Encounter for surveillance of contraceptives, unspecified: Secondary | ICD-10-CM

## 2013-12-27 ENCOUNTER — Encounter (HOSPITAL_COMMUNITY): Payer: Self-pay | Admitting: *Deleted

## 2014-03-04 ENCOUNTER — Emergency Department (HOSPITAL_COMMUNITY)
Admission: EM | Admit: 2014-03-04 | Discharge: 2014-03-05 | Discharge status: Against Medical Advice | Payer: BLUE CROSS/BLUE SHIELD | Attending: Emergency Medicine | Admitting: Emergency Medicine

## 2014-03-04 ENCOUNTER — Encounter (HOSPITAL_COMMUNITY): Payer: Self-pay | Admitting: Emergency Medicine

## 2014-03-04 DIAGNOSIS — R109 Unspecified abdominal pain: Secondary | ICD-10-CM | POA: Insufficient documentation

## 2014-03-04 LAB — CBC WITH DIFFERENTIAL/PLATELET
Basophils Absolute: 0 10*3/uL (ref 0.0–0.1)
Basophils Relative: 1 % (ref 0–1)
Eosinophils Absolute: 0.4 10*3/uL (ref 0.0–0.7)
Eosinophils Relative: 4 % (ref 0–5)
HEMATOCRIT: 38.9 % (ref 36.0–46.0)
Hemoglobin: 12.6 g/dL (ref 12.0–15.0)
LYMPHS ABS: 3.2 10*3/uL (ref 0.7–4.0)
Lymphocytes Relative: 36 % (ref 12–46)
MCH: 26.8 pg (ref 26.0–34.0)
MCHC: 32.4 g/dL (ref 30.0–36.0)
MCV: 82.6 fL (ref 78.0–100.0)
MONO ABS: 0.6 10*3/uL (ref 0.1–1.0)
Monocytes Relative: 7 % (ref 3–12)
NEUTROS ABS: 4.7 10*3/uL (ref 1.7–7.7)
Neutrophils Relative %: 52 % (ref 43–77)
PLATELETS: 398 10*3/uL (ref 150–400)
RBC: 4.71 MIL/uL (ref 3.87–5.11)
RDW: 13.4 % (ref 11.5–15.5)
WBC: 8.9 10*3/uL (ref 4.0–10.5)

## 2014-03-04 NOTE — ED Notes (Signed)
Patient complaining of "sharp" abdominal pain in the lower, bilateral abdomen. Denies emesis, diarrhea, SOB, chest pain. Has not taken anything for pain today. Aggravating factor includes movement but denies worsening pain after eating. No other complaints.

## 2014-03-05 LAB — COMPREHENSIVE METABOLIC PANEL
ALBUMIN: 3.6 g/dL (ref 3.5–5.2)
ALT: 16 U/L (ref 0–35)
AST: 21 U/L (ref 0–37)
Alkaline Phosphatase: 57 U/L (ref 39–117)
Anion gap: 7 (ref 5–15)
BUN: 11 mg/dL (ref 6–23)
CHLORIDE: 105 meq/L (ref 96–112)
CO2: 23 mmol/L (ref 19–32)
CREATININE: 0.57 mg/dL (ref 0.50–1.10)
Calcium: 8.7 mg/dL (ref 8.4–10.5)
GLUCOSE: 107 mg/dL — AB (ref 70–99)
POTASSIUM: 3.7 mmol/L (ref 3.5–5.1)
SODIUM: 135 mmol/L (ref 135–145)
TOTAL PROTEIN: 8 g/dL (ref 6.0–8.3)
Total Bilirubin: 0.5 mg/dL (ref 0.3–1.2)

## 2014-03-05 LAB — LIPASE, BLOOD: Lipase: 21 U/L (ref 11–59)

## 2015-10-17 ENCOUNTER — Emergency Department (HOSPITAL_COMMUNITY)
Admission: EM | Admit: 2015-10-17 | Discharge: 2015-10-17 | Discharge status: Against Medical Advice | Payer: BLUE CROSS/BLUE SHIELD | Attending: Emergency Medicine | Admitting: Emergency Medicine

## 2015-10-17 ENCOUNTER — Encounter (HOSPITAL_COMMUNITY): Payer: Self-pay | Admitting: Emergency Medicine

## 2015-10-17 ENCOUNTER — Emergency Department (HOSPITAL_COMMUNITY): Payer: BLUE CROSS/BLUE SHIELD

## 2015-10-17 DIAGNOSIS — Z5321 Procedure and treatment not carried out due to patient leaving prior to being seen by health care provider: Secondary | ICD-10-CM | POA: Insufficient documentation

## 2015-10-17 DIAGNOSIS — R791 Abnormal coagulation profile: Secondary | ICD-10-CM | POA: Insufficient documentation

## 2015-10-17 DIAGNOSIS — R200 Anesthesia of skin: Secondary | ICD-10-CM | POA: Insufficient documentation

## 2015-10-17 DIAGNOSIS — R51 Headache: Secondary | ICD-10-CM | POA: Diagnosis not present

## 2015-10-17 LAB — COMPREHENSIVE METABOLIC PANEL
ALBUMIN: 3.4 g/dL — AB (ref 3.5–5.0)
ALT: 19 U/L (ref 14–54)
ANION GAP: 5 (ref 5–15)
AST: 18 U/L (ref 15–41)
Alkaline Phosphatase: 64 U/L (ref 38–126)
BUN: 7 mg/dL (ref 6–20)
CO2: 24 mmol/L (ref 22–32)
Calcium: 8.8 mg/dL — ABNORMAL LOW (ref 8.9–10.3)
Chloride: 109 mmol/L (ref 101–111)
Creatinine, Ser: 0.61 mg/dL (ref 0.44–1.00)
GFR calc non Af Amer: >60 mL/min (ref >60)
GLUCOSE: 89 mg/dL (ref 65–99)
POTASSIUM: 3.9 mmol/L (ref 3.5–5.1)
SODIUM: 138 mmol/L (ref 135–145)
TOTAL PROTEIN: 7.3 g/dL (ref 6.5–8.1)
Total Bilirubin: 0.4 mg/dL (ref 0.3–1.2)

## 2015-10-17 LAB — I-STAT CHEM 8, ED
BUN: 7 mg/dL (ref 6–20)
CALCIUM ION: 1.15 mmol/L (ref 1.13–1.30)
CHLORIDE: 106 mmol/L (ref 101–111)
CREATININE: 0.6 mg/dL (ref 0.44–1.00)
GLUCOSE: 91 mg/dL (ref 65–99)
HCT: 37 % (ref 36.0–46.0)
Hemoglobin: 12.6 g/dL (ref 12.0–15.0)
POTASSIUM: 3.9 mmol/L (ref 3.5–5.1)
Sodium: 142 mmol/L (ref 135–145)
TCO2: 25 mmol/L (ref 0–100)

## 2015-10-17 LAB — CBC
HCT: 38.9 % (ref 36.0–46.0)
Hemoglobin: 12.3 g/dL (ref 12.0–15.0)
MCH: 26.3 pg (ref 26.0–34.0)
MCHC: 31.6 g/dL (ref 30.0–36.0)
MCV: 83.1 fL (ref 78.0–100.0)
PLATELETS: 398 10*3/uL (ref 150–400)
RBC: 4.68 MIL/uL (ref 3.87–5.11)
RDW: 13.9 % (ref 11.5–15.5)
WBC: 8.6 10*3/uL (ref 4.0–10.5)

## 2015-10-17 LAB — DIFFERENTIAL
BASOS PCT: 1 %
Basophils Absolute: 0.1 10*3/uL (ref 0.0–0.1)
EOS ABS: 0.8 10*3/uL — AB (ref 0.0–0.7)
EOS PCT: 9 %
Lymphocytes Relative: 41 %
Lymphs Abs: 3.6 10*3/uL (ref 0.7–4.0)
MONO ABS: 0.6 10*3/uL (ref 0.1–1.0)
Monocytes Relative: 7 %
NEUTROS PCT: 42 %
Neutro Abs: 3.7 10*3/uL (ref 1.7–7.7)

## 2015-10-17 LAB — PROTIME-INR
INR: 1.06
PROTHROMBIN TIME: 13.9 s (ref 11.4–15.2)

## 2015-10-17 LAB — CBG MONITORING, ED: Glucose-Capillary: 91 mg/dL (ref 65–99)

## 2015-10-17 LAB — APTT: aPTT: 32 seconds (ref 24–36)

## 2015-10-17 LAB — I-STAT TROPONIN, ED: Troponin i, poc: 0 ng/mL (ref 0.00–0.08)

## 2015-10-17 NOTE — ED Triage Notes (Signed)
Unable to locate patient in waiting area for CT scan. Assumed to have left. Patient did not speak with staff prior to departing.

## 2015-10-17 NOTE — ED Triage Notes (Addendum)
Patient arrives with complaint of left sided numbness extending from foot to left arm coupled with intermittent headache. States onset today at 1500. Explains that [place] [employer] [place]. Denies history of similar and migraines. Grossly neuro intact in triage. MD Rhunette CroftNanavati informed of presenting complaint and MD advised this RN that he will evaluate patient in triage.

## 2016-10-23 LAB — OB RESULTS CONSOLE RPR: RPR: NONREACTIVE

## 2016-10-23 LAB — OB RESULTS CONSOLE GC/CHLAMYDIA
CHLAMYDIA, DNA PROBE: NEGATIVE
GC PROBE AMP, GENITAL: NEGATIVE

## 2016-10-23 LAB — OB RESULTS CONSOLE ABO/RH: RH Type: POSITIVE

## 2016-10-23 LAB — OB RESULTS CONSOLE RUBELLA ANTIBODY, IGM: Rubella: IMMUNE

## 2016-10-23 LAB — OB RESULTS CONSOLE HEPATITIS B SURFACE ANTIGEN: Hepatitis B Surface Ag: NEGATIVE

## 2016-10-23 LAB — OB RESULTS CONSOLE HIV ANTIBODY (ROUTINE TESTING): HIV: NONREACTIVE

## 2016-10-23 LAB — OB RESULTS CONSOLE ANTIBODY SCREEN: Antibody Screen: NEGATIVE

## 2017-02-06 ENCOUNTER — Other Ambulatory Visit (HOSPITAL_COMMUNITY): Payer: Self-pay | Admitting: Obstetrics and Gynecology

## 2017-02-06 DIAGNOSIS — Z363 Encounter for antenatal screening for malformations: Secondary | ICD-10-CM

## 2017-02-07 ENCOUNTER — Encounter (HOSPITAL_COMMUNITY): Payer: Self-pay | Admitting: *Deleted

## 2017-02-11 ENCOUNTER — Other Ambulatory Visit (HOSPITAL_COMMUNITY): Payer: Self-pay | Admitting: Obstetrics and Gynecology

## 2017-02-11 ENCOUNTER — Encounter (HOSPITAL_COMMUNITY): Payer: Self-pay

## 2017-02-11 ENCOUNTER — Ambulatory Visit (HOSPITAL_COMMUNITY): Admission: RE | Admit: 2017-02-11 | Payer: Commercial Managed Care - PPO | Source: Ambulatory Visit

## 2017-02-11 ENCOUNTER — Ambulatory Visit (HOSPITAL_COMMUNITY)
Admission: RE | Admit: 2017-02-11 | Discharge: 2017-02-11 | Disposition: A | Discharge status: Home / Self Care | Payer: Commercial Managed Care - PPO | Source: Ambulatory Visit | Attending: Obstetrics and Gynecology | Admitting: Obstetrics and Gynecology

## 2017-02-11 DIAGNOSIS — Z3A26 26 weeks gestation of pregnancy: Secondary | ICD-10-CM | POA: Insufficient documentation

## 2017-02-11 DIAGNOSIS — O99212 Obesity complicating pregnancy, second trimester: Secondary | ICD-10-CM

## 2017-02-11 DIAGNOSIS — O24012 Pre-existing diabetes mellitus, type 1, in pregnancy, second trimester: Secondary | ICD-10-CM | POA: Diagnosis present

## 2017-02-11 DIAGNOSIS — Z363 Encounter for antenatal screening for malformations: Secondary | ICD-10-CM

## 2017-02-11 DIAGNOSIS — O4102X0 Oligohydramnios, second trimester, not applicable or unspecified: Secondary | ICD-10-CM | POA: Insufficient documentation

## 2017-02-11 HISTORY — DX: Polycystic ovarian syndrome: E28.2

## 2017-02-12 ENCOUNTER — Encounter (HOSPITAL_COMMUNITY): Payer: Self-pay

## 2017-02-25 NOTE — L&D Delivery Note (Signed)
Delivery Note At  a viable female was delivered via  (Presentation:vtx ; OA ).  APGAR:9 ,9 ; weight  .   Placenta status: complete, .   3V Cord:  with the following complications:none .   None  Anesthesia:  Epidural Episiotomy:  None Lacerations:  1st Suture Repair: vicryl 4-0 Est. Blood Loss (mL):  150cc  Mom to postpartum.  Baby to skin to skin.  Henderson Newcomerancy Jean Prothero 05/12/2017, 9:08 PM

## 2017-04-24 LAB — OB RESULTS CONSOLE GBS: GBS: NEGATIVE

## 2017-05-06 ENCOUNTER — Telehealth (HOSPITAL_COMMUNITY): Payer: Self-pay | Admitting: *Deleted

## 2017-05-06 ENCOUNTER — Other Ambulatory Visit: Payer: Self-pay | Admitting: Advanced Practice Midwife

## 2017-05-06 ENCOUNTER — Encounter (HOSPITAL_COMMUNITY): Payer: Self-pay | Admitting: *Deleted

## 2017-05-06 NOTE — Telephone Encounter (Signed)
Preadmission screen  

## 2017-05-08 ENCOUNTER — Other Ambulatory Visit: Payer: Self-pay | Admitting: Obstetrics & Gynecology

## 2017-05-09 ENCOUNTER — Observation Stay (HOSPITAL_COMMUNITY)
Admission: EM | Admit: 2017-05-09 | Discharge: 2017-05-10 | Disposition: A | Discharge status: Home / Self Care | Payer: No Typology Code available for payment source | Attending: Emergency Medicine | Admitting: Emergency Medicine

## 2017-05-09 ENCOUNTER — Inpatient Hospital Stay (HOSPITAL_COMMUNITY): Payer: No Typology Code available for payment source

## 2017-05-09 ENCOUNTER — Encounter (HOSPITAL_COMMUNITY): Payer: Self-pay | Admitting: Emergency Medicine

## 2017-05-09 ENCOUNTER — Emergency Department (HOSPITAL_COMMUNITY): Payer: No Typology Code available for payment source

## 2017-05-09 DIAGNOSIS — M25561 Pain in right knee: Secondary | ICD-10-CM | POA: Insufficient documentation

## 2017-05-09 DIAGNOSIS — R1084 Generalized abdominal pain: Secondary | ICD-10-CM | POA: Diagnosis not present

## 2017-05-09 DIAGNOSIS — W2211XA Striking against or struck by driver side automobile airbag, initial encounter: Secondary | ICD-10-CM | POA: Diagnosis not present

## 2017-05-09 DIAGNOSIS — M25531 Pain in right wrist: Secondary | ICD-10-CM | POA: Insufficient documentation

## 2017-05-09 DIAGNOSIS — Y92410 Unspecified street and highway as the place of occurrence of the external cause: Secondary | ICD-10-CM | POA: Insufficient documentation

## 2017-05-09 DIAGNOSIS — Y999 Unspecified external cause status: Secondary | ICD-10-CM | POA: Diagnosis not present

## 2017-05-09 DIAGNOSIS — O9989 Other specified diseases and conditions complicating pregnancy, childbirth and the puerperium: Secondary | ICD-10-CM | POA: Diagnosis present

## 2017-05-09 DIAGNOSIS — M79641 Pain in right hand: Secondary | ICD-10-CM | POA: Diagnosis not present

## 2017-05-09 DIAGNOSIS — Y9389 Activity, other specified: Secondary | ICD-10-CM | POA: Diagnosis not present

## 2017-05-09 DIAGNOSIS — O9A213 Injury, poisoning and certain other consequences of external causes complicating pregnancy, third trimester: Secondary | ICD-10-CM | POA: Insufficient documentation

## 2017-05-09 DIAGNOSIS — M79631 Pain in right forearm: Secondary | ICD-10-CM | POA: Insufficient documentation

## 2017-05-09 DIAGNOSIS — Z3A38 38 weeks gestation of pregnancy: Secondary | ICD-10-CM | POA: Insufficient documentation

## 2017-05-09 DIAGNOSIS — O368390 Maternal care for abnormalities of the fetal heart rate or rhythm, unspecified trimester, not applicable or unspecified: Secondary | ICD-10-CM

## 2017-05-09 HISTORY — DX: Other specified health status: Z78.9

## 2017-05-09 LAB — COMPREHENSIVE METABOLIC PANEL
ALBUMIN: 2.7 g/dL — AB (ref 3.5–5.0)
ALT: 13 U/L — ABNORMAL LOW (ref 14–54)
ANION GAP: 8 (ref 5–15)
AST: 19 U/L (ref 15–41)
Alkaline Phosphatase: 102 U/L (ref 38–126)
BUN: <5 mg/dL — ABNORMAL LOW (ref 6–20)
CO2: 20 mmol/L — AB (ref 22–32)
Calcium: 8.8 mg/dL — ABNORMAL LOW (ref 8.9–10.3)
Chloride: 107 mmol/L (ref 101–111)
Creatinine, Ser: 0.49 mg/dL (ref 0.44–1.00)
GFR calc non Af Amer: >60 mL/min (ref >60)
GLUCOSE: 95 mg/dL (ref 65–99)
POTASSIUM: 3.6 mmol/L (ref 3.5–5.1)
SODIUM: 135 mmol/L (ref 135–145)
TOTAL PROTEIN: 6.2 g/dL — AB (ref 6.5–8.1)
Total Bilirubin: 0.5 mg/dL (ref 0.3–1.2)

## 2017-05-09 LAB — CBC
HEMATOCRIT: 33.8 % — AB (ref 36.0–46.0)
Hemoglobin: 11.3 g/dL — ABNORMAL LOW (ref 12.0–15.0)
MCH: 27.2 pg (ref 26.0–34.0)
MCHC: 33.4 g/dL (ref 30.0–36.0)
MCV: 81.3 fL (ref 78.0–100.0)
Platelets: 298 10*3/uL (ref 150–400)
RBC: 4.16 MIL/uL (ref 3.87–5.11)
RDW: 14.4 % (ref 11.5–15.5)
WBC: 5.3 10*3/uL (ref 4.0–10.5)

## 2017-05-09 LAB — ETHANOL: Alcohol, Ethyl (B): <10 mg/dL (ref <10)

## 2017-05-09 LAB — PROTIME-INR
INR: 1.01
Prothrombin Time: 13.2 seconds (ref 11.4–15.2)

## 2017-05-09 LAB — I-STAT CHEM 8, ED
BUN: <3 mg/dL — ABNORMAL LOW (ref 6–20)
CALCIUM ION: 1.13 mmol/L — AB (ref 1.15–1.40)
Chloride: 106 mmol/L (ref 101–111)
Creatinine, Ser: 0.4 mg/dL — ABNORMAL LOW (ref 0.44–1.00)
GLUCOSE: 97 mg/dL (ref 65–99)
HCT: 32 % — ABNORMAL LOW (ref 36.0–46.0)
Hemoglobin: 10.9 g/dL — ABNORMAL LOW (ref 12.0–15.0)
Potassium: 3.7 mmol/L (ref 3.5–5.1)
SODIUM: 137 mmol/L (ref 135–145)
TCO2: 20 mmol/L — AB (ref 22–32)

## 2017-05-09 LAB — I-STAT CG4 LACTIC ACID, ED: LACTIC ACID, VENOUS: 1.48 mmol/L (ref 0.5–1.9)

## 2017-05-09 LAB — SAMPLE TO BLOOD BANK

## 2017-05-09 LAB — CDS SEROLOGY

## 2017-05-09 LAB — ABO/RH: ABO/RH(D): A POS
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
## 2017-05-09 NOTE — Progress Notes (Signed)
Report recv'd, care assumed as RROB.
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
## 2017-05-09 NOTE — ED Notes (Signed)
Pt lying R side
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
## 2017-05-09 NOTE — Progress Notes (Signed)
[facility] in for transport to [facility], room 302.
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
## 2017-05-09 NOTE — Progress Notes (Signed)
Patient up to restroom with ED RN. Continue awaiting [facility] transport to [facility]
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
## 2017-05-09 NOTE — ED Notes (Signed)
[facility] with [facility] on stretcher, husband to follow in POV; [facility]

## 2017-05-10 ENCOUNTER — Encounter: Payer: Self-pay | Admitting: Obstetrics and Gynecology

## 2017-05-10 ENCOUNTER — Other Ambulatory Visit: Payer: Self-pay

## 2017-05-10 LAB — TYPE AND SCREEN
ABO/RH(D): A POS
Antibody Screen: NEGATIVE

## 2017-05-10 LAB — ABO/RH: ABO/RH(D): A POS

## 2017-05-10 MED ORDER — OXYCODONE-ACETAMINOPHEN 5-325 MG PO TABS
2.0000 | ORAL_TABLET | Freq: Once | ORAL | Status: AC
  Administered 2017-05-10: 2 via ORAL
  Filled 2017-05-10: qty 2

## 2017-05-10 MED ORDER — OXYCODONE-ACETAMINOPHEN 5-325 MG PO TABS: 1.0000 | ORAL_TABLET | ORAL | 0 refills | Status: AC | PRN

## 2017-05-10 NOTE — Discharge Instructions (Signed)
Call for any bleeding, leaking, less baby movement, or severe pain  Fetal Movement Counts  What is a fetal movement count? A fetal movement count is the number of times that you feel your baby move during a certain amount of time. This may also be called a fetal kick count. A fetal movement count is recommended for every pregnant woman. You may be asked to start counting fetal movements as early as week 28 of your pregnancy. Pay attention to when your baby is most active. You may notice your baby's sleep and wake cycles. You may also notice things that make your baby move more. You should do a fetal movement count:  When your baby is normally most active.  At the same time each day.  A good time to count movements is while you are resting, after having something to eat and drink. How do I count fetal movements? 1. Find a quiet, comfortable area. Sit, or lie down on your side. 2. Write down the date, the start time and stop time, and the number of movements that you felt between those two times. Take this information with you to your health care visits. 3. For 2 hours, count kicks, flutters, swishes, rolls, and jabs. You should feel at least 10 movements during 2 hours. 4. You may stop counting after you have felt 10 movements. 5. If you do not feel 10 movements in 2 hours, have something to eat and drink. Then, keep resting and counting for 1 hour. If you feel at least 4 movements during that hour, you may stop counting. Contact a health care provider if:  You feel fewer than 4 movements in 2 hours.  Your baby is not moving like he or she usually does.

## 2017-05-10 NOTE — Progress Notes (Signed)
D/c instructions reviewed, signed, & given. IOL on 3/18 confirmed with L&D & pt.  Pt denies ques/concerns.  Pt d/c home in stable condition via w/c with family.
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
## 2017-05-12 ENCOUNTER — Inpatient Hospital Stay (HOSPITAL_COMMUNITY): Payer: Commercial Managed Care - PPO | Admitting: Anesthesiology

## 2017-05-12 ENCOUNTER — Other Ambulatory Visit: Payer: Self-pay

## 2017-05-12 ENCOUNTER — Encounter (HOSPITAL_COMMUNITY): Payer: Self-pay

## 2017-05-12 ENCOUNTER — Inpatient Hospital Stay (HOSPITAL_COMMUNITY)
Admission: RE | Admit: 2017-05-12 | Discharge: 2017-05-14 | DRG: 805 | Disposition: A | Discharge status: Home / Self Care | Payer: Commercial Managed Care - PPO | Source: Ambulatory Visit | Attending: Obstetrics & Gynecology | Admitting: Obstetrics & Gynecology

## 2017-05-12 ENCOUNTER — Telehealth (HOSPITAL_COMMUNITY): Payer: Self-pay | Admitting: *Deleted

## 2017-05-12 DIAGNOSIS — N90810 Female genital mutilation status, unspecified: Secondary | ICD-10-CM | POA: Diagnosis present

## 2017-05-12 DIAGNOSIS — O2432 Unspecified pre-existing diabetes mellitus in childbirth: Secondary | ICD-10-CM | POA: Diagnosis present

## 2017-05-12 DIAGNOSIS — Z3A39 39 weeks gestation of pregnancy: Secondary | ICD-10-CM

## 2017-05-12 DIAGNOSIS — E119 Type 2 diabetes mellitus without complications: Secondary | ICD-10-CM | POA: Diagnosis present

## 2017-05-12 DIAGNOSIS — O3483 Maternal care for other abnormalities of pelvic organs, third trimester: Secondary | ICD-10-CM | POA: Diagnosis present

## 2017-05-12 DIAGNOSIS — O4103X0 Oligohydramnios, third trimester, not applicable or unspecified: Secondary | ICD-10-CM | POA: Diagnosis present

## 2017-05-12 LAB — CBC
HCT: 34.8 % — ABNORMAL LOW (ref 36.0–46.0)
Hemoglobin: 11.6 g/dL — ABNORMAL LOW (ref 12.0–15.0)
MCH: 26.7 pg (ref 26.0–34.0)
MCHC: 33.3 g/dL (ref 30.0–36.0)
MCV: 80 fL (ref 78.0–100.0)
PLATELETS: 318 10*3/uL (ref 150–400)
RBC: 4.35 MIL/uL (ref 3.87–5.11)
RDW: 14.1 % (ref 11.5–15.5)
WBC: 5.3 10*3/uL (ref 4.0–10.5)

## 2017-05-12 LAB — TYPE AND SCREEN
ABO/RH(D): A POS
ANTIBODY SCREEN: NEGATIVE

## 2017-05-12 LAB — ABO/RH: ABO/RH(D): A POS

## 2017-05-12 MED ORDER — SIMETHICONE 80 MG PO CHEW: 80.0000 mg | CHEWABLE_TABLET | ORAL | Status: DC | PRN

## 2017-05-12 MED ORDER — PHENYLEPHRINE 40 MCG/ML (10ML) SYRINGE FOR IV PUSH (FOR BLOOD PRESSURE SUPPORT)
80.0000 ug | PREFILLED_SYRINGE | INTRAVENOUS | Status: DC | PRN
  Filled 2017-05-12: qty 5

## 2017-05-12 MED ORDER — SENNOSIDES-DOCUSATE SODIUM 8.6-50 MG PO TABS
2.0000 | ORAL_TABLET | ORAL | Status: DC
  Administered 2017-05-13: 2 via ORAL
  Filled 2017-05-12: qty 2

## 2017-05-12 MED ORDER — LACTATED RINGERS IV SOLN
500.0000 mL | Freq: Once | INTRAVENOUS | Status: AC
Start: 2017-05-12 — End: 2017-05-12
  Administered 2017-05-12: 500 mL via INTRAVENOUS

## 2017-05-12 MED ORDER — PRENATAL MULTIVITAMIN CH
1.0000 | ORAL_TABLET | Freq: Every day | ORAL | Status: DC
  Administered 2017-05-13: 1 via ORAL
  Filled 2017-05-12: qty 1

## 2017-05-12 MED ORDER — TERBUTALINE SULFATE 1 MG/ML IJ SOLN
0.2500 mg | Freq: Once | INTRAMUSCULAR | Status: DC | PRN
  Filled 2017-05-12: qty 1

## 2017-05-12 MED ORDER — BENZOCAINE-MENTHOL 20-0.5 % EX AERO: 1.0000 "application " | INHALATION_SPRAY | CUTANEOUS | Status: DC | PRN

## 2017-05-12 MED ORDER — LACTATED RINGERS IV SOLN: 500.0000 mL | INTRAVENOUS | Status: DC | PRN

## 2017-05-12 MED ORDER — ONDANSETRON HCL 4 MG/2ML IJ SOLN: 4.0000 mg | Freq: Four times a day (QID) | INTRAMUSCULAR | Status: DC | PRN

## 2017-05-12 MED ORDER — EPHEDRINE 5 MG/ML INJ
10.0000 mg | INTRAVENOUS | Status: DC | PRN
  Filled 2017-05-12: qty 2

## 2017-05-12 MED ORDER — TETANUS-DIPHTH-ACELL PERTUSSIS 5-2.5-18.5 LF-MCG/0.5 IM SUSP: 0.5000 mL | Freq: Once | INTRAMUSCULAR | Status: DC

## 2017-05-12 MED ORDER — IBUPROFEN 600 MG PO TABS
600.0000 mg | ORAL_TABLET | Freq: Four times a day (QID) | ORAL | Status: DC
  Administered 2017-05-12 – 2017-05-14 (×7): 600 mg via ORAL
  Filled 2017-05-12 (×7): qty 1

## 2017-05-12 MED ORDER — FENTANYL CITRATE (PF) 100 MCG/2ML IJ SOLN: 50.0000 ug | INTRAMUSCULAR | Status: DC | PRN

## 2017-05-12 MED ORDER — COCONUT OIL OIL: 1.0000 "application " | TOPICAL_OIL | Status: DC | PRN

## 2017-05-12 MED ORDER — OXYTOCIN 40 UNITS IN LACTATED RINGERS INFUSION - SIMPLE MED: 2.5000 [IU]/h | INTRAVENOUS | Status: DC

## 2017-05-12 MED ORDER — LIDOCAINE HCL (PF) 1 % IJ SOLN
INTRAMUSCULAR | Status: DC | PRN
  Administered 2017-05-12: 6 mL via EPIDURAL
  Administered 2017-05-12: 4 mL

## 2017-05-12 MED ORDER — LIDOCAINE HCL (PF) 1 % IJ SOLN
30.0000 mL | INTRAMUSCULAR | Status: DC | PRN
  Filled 2017-05-12: qty 30

## 2017-05-12 MED ORDER — DIPHENHYDRAMINE HCL 50 MG/ML IJ SOLN: 12.5000 mg | INTRAMUSCULAR | Status: DC | PRN

## 2017-05-12 MED ORDER — ZOLPIDEM TARTRATE 5 MG PO TABS: 5.0000 mg | ORAL_TABLET | Freq: Every evening | ORAL | Status: DC | PRN

## 2017-05-12 MED ORDER — FENTANYL 2.5 MCG/ML BUPIVACAINE 1/10 % EPIDURAL INFUSION (WH - ANES)
14.0000 mL/h | INTRAMUSCULAR | Status: DC | PRN
  Administered 2017-05-12: 14 mL/h via EPIDURAL
  Filled 2017-05-12: qty 100

## 2017-05-12 MED ORDER — WITCH HAZEL-GLYCERIN EX PADS: 1.0000 "application " | MEDICATED_PAD | CUTANEOUS | Status: DC | PRN

## 2017-05-12 MED ORDER — ACETAMINOPHEN 325 MG PO TABS: 650.0000 mg | ORAL_TABLET | ORAL | Status: DC | PRN

## 2017-05-12 MED ORDER — OXYTOCIN BOLUS FROM INFUSION
500.0000 mL | Freq: Once | INTRAVENOUS | Status: AC
  Administered 2017-05-12: 500 mL via INTRAVENOUS

## 2017-05-12 MED ORDER — OXYTOCIN 40 UNITS IN LACTATED RINGERS INFUSION - SIMPLE MED
1.0000 m[IU]/min | INTRAVENOUS | Status: DC
  Administered 2017-05-12: 6 m[IU]/min via INTRAVENOUS
  Administered 2017-05-12: 14 m[IU]/min via INTRAVENOUS
  Administered 2017-05-12: 8 m[IU]/min via INTRAVENOUS
  Administered 2017-05-12: 10 m[IU]/min via INTRAVENOUS
  Administered 2017-05-12: 16 m[IU]/min via INTRAVENOUS
  Administered 2017-05-12: 2 m[IU]/min via INTRAVENOUS
  Administered 2017-05-12: 12 m[IU]/min via INTRAVENOUS
  Administered 2017-05-12: 4 m[IU]/min via INTRAVENOUS
  Filled 2017-05-12: qty 1000

## 2017-05-12 MED ORDER — DIBUCAINE 1 % RE OINT: 1.0000 "application " | TOPICAL_OINTMENT | RECTAL | Status: DC | PRN

## 2017-05-12 MED ORDER — FLEET ENEMA 7-19 GM/118ML RE ENEM: 1.0000 | ENEMA | RECTAL | Status: DC | PRN

## 2017-05-12 MED ORDER — ONDANSETRON HCL 4 MG PO TABS: 4.0000 mg | ORAL_TABLET | ORAL | Status: DC | PRN

## 2017-05-12 MED ORDER — LACTATED RINGERS IV SOLN
INTRAVENOUS | Status: DC
  Administered 2017-05-12 (×3): via INTRAVENOUS

## 2017-05-12 MED ORDER — ONDANSETRON HCL 4 MG/2ML IJ SOLN: 4.0000 mg | INTRAMUSCULAR | Status: DC | PRN

## 2017-05-12 MED ORDER — DIPHENHYDRAMINE HCL 25 MG PO CAPS: 25.0000 mg | ORAL_CAPSULE | Freq: Four times a day (QID) | ORAL | Status: DC | PRN

## 2017-05-12 MED ORDER — SOD CITRATE-CITRIC ACID 500-334 MG/5ML PO SOLN: 30.0000 mL | ORAL | Status: DC | PRN

## 2017-05-12 NOTE — Progress Notes (Signed)
Subjective: Pt comfortable after epidural.    Objective: BP 117/66 (BP Location: Left Arm)   Pulse 89   Temp 99.2 F (37.3 C) (Oral)   Resp 16   Ht 5' 0.6" (1.539 m)   Wt 98.9 kg (218 lb)   LMP 08/16/2016 (LMP Unknown)   SpO2 100%   BMI 41.74 kg/m  No intake/output data recorded. No intake/output data recorded.  FHT: Category 1 UC:   regular, every 1-2 minutes SVE:   Dilation: 5 Effacement (%): 70 Station: -1 Exam by:: Bernerd PhoNancy Mendell Bontempo, CNM Pitocin at 16mu Tachysystole noted halved pitocin to 8 mu AROM clear fluid.  Assessment:  37 yo G3P2002 at 39 weeks with oligohydramnios Cat 1 strip  Plan: Resumed care at 1900 from Dr. Mora ApplPinn. Anticipate SVD  Kenney HousemanNancy Jean Lydiann Bonifas CNM, MSN 05/12/2017, 7:36 PM

## 2017-05-12 NOTE — H&P (Signed)
Sherry Frey is a 37 y.o. female G3P2002 @ 39 weeks 1 day presenting for induction of labor due to oligohydramnios at term.  Patient denies any ctx, no leaking of fluid, no vaginal bleeding, reports normal fetal movement.   OB History    Gravida Para Term Preterm AB Living   3 2 2     2    SAB TAB Ectopic Multiple Live Births           2     Past Medical History:  Diagnosis Date  . Female circumcision   . No pertinent past medical history   . PCOS (polycystic ovarian syndrome)    Past Surgical History:  Procedure Laterality Date  . 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 Social History:  reports that  has never smoked. she has never used smokeless tobacco. She reports that she does not drink alcohol or use drugs.     Maternal Diabetes: No Genetic Screening: Declined Maternal Ultrasounds/Referrals: Normal Fetal Ultrasounds or other Referrals:  None Maternal Substance Abuse:  No Significant Maternal Medications:  None Significant Maternal Lab Results:  Lab values include: Group B Strep negative Other Comments:  None  ROS  As per HPI Maternal Medical History:  Contractions: Frequency: irregular and rare.   Perceived severity is mild.    Fetal activity: Perceived fetal activity is normal.   Last perceived fetal movement was within the past hour.    Prenatal complications: Oligohydramnios.   Prenatal Complications - Diabetes: none.      Last menstrual period 08/16/2016. Maternal Exam:  Uterine Assessment: Contraction strength is mild.  Contraction frequency is rare.   Abdomen: Fundal height is 39 cm.   Estimated fetal weight is 2800 grams.   Fetal presentation: vertex  Introitus: Normal vulva. Normal vagina.  Ferning test: not done.  Nitrazine test: not done. Amniotic fluid character: not assessed.  Pelvis: adequate for delivery.      Fetal Exam Fetal Monitor Review: Baseline  rate: 145.  Variability: moderate (6-25 bpm).   Pattern: accelerations present and no decelerations.    Fetal State Assessment: Category I - tracings are normal.     Physical Exam  Nursing note and vitals reviewed. Constitutional: She appears well-developed and well-nourished.    Prenatal labs: ABO, Rh: A/Positive/-- (08/29 0000) Antibody: Negative (08/29 0000) Rubella: Immune (08/29 0000) RPR: Nonreactive (08/29 0000)  HBsAg: Negative (08/29 0000)  HIV: Non-reactive (08/29 0000)  GBS:   NEG  Assessment/Plan: 37 yo G3P2002 at 39 weeks with oligohydramnios Admit to Labor and Delivery Continuous monitoring Epidural on demand Pitocin for induction AROM when head is well applied    Sherry Frey [name] 05/12/2017, 7:39 AM

## 2017-05-12 NOTE — Anesthesia Procedure Notes (Signed)
Epidural Patient location during procedure: OB  Staffing Anesthesiologist: Govani Radloff, MD  Preanesthetic Checklist Completed: patient identified, pre-op evaluation, timeout performed, IV checked, risks and benefits discussed and monitors and equipment checked  Epidural Patient position: sitting Prep: DuraPrep Patient monitoring: blood pressure and continuous pulse ox Approach: right paramedian Location: L3-L4 Injection technique: LOR air  Needle:  Needle type: Tuohy  Needle gauge: 17 G Needle insertion depth: 5 cm Catheter type: closed end flexible Catheter size: 19 Gauge Catheter at skin depth: 10 cm Test dose: negative  Assessment Sensory level: T8  Additional Notes   Dosing of Epidural:  1st dose, through catheter .............................................  Xylocaine 40 mg  2nd dose, through catheter, after waiting 3 minutes.........Xylocaine 60 mg    As each dose occurred, patient was free of IV sx; and patient exhibited no evidence of SA injection.  Patient is more comfortable after epidural dosed. Please see RN's note for documentation of vital signs,and FHR which are stable.  Patient reminded not to try to ambulate with numb legs, and that an RN must be present when she attempts to get up.          

## 2017-05-12 NOTE — Anesthesia Preprocedure Evaluation (Signed)
Anesthesia Evaluation  Patient identified by MRN, date of birth, ID band Patient awake    Reviewed: Allergy & Precautions, H&P , Patient's Chart, lab work & pertinent test results, reviewed documented beta blocker date and time   Airway Mallampati: II  TM Distance: >3 FB Neck ROM: full    Dental no notable dental hx.    Pulmonary    Pulmonary exam normal breath sounds clear to auscultation       Cardiovascular  Rhythm:regular Rate:Normal     Neuro/Psych    GI/Hepatic   Endo/Other    Renal/GU      Musculoskeletal   Abdominal   Peds  Hematology   Anesthesia Other Findings   Reproductive/Obstetrics                             Anesthesia Physical Anesthesia Plan  ASA: III  Anesthesia Plan: Epidural   Post-op Pain Management:    Induction:   PONV Risk Score and Plan:   Airway Management Planned:   Additional Equipment:   Intra-op Plan:   Post-operative Plan:   Informed Consent: I have reviewed the patients History and Physical, chart, labs and discussed the procedure including the risks, benefits and alternatives for the proposed anesthesia with the patient or authorized representative who has indicated his/her understanding and acceptance.   Dental Advisory Given  Plan Discussed with: CRNA and Surgeon  Anesthesia Plan Comments: (Labs checked- platelets confirmed with RN in room. Fetal heart tracing, per RN, reported to be stable enough for sitting procedure. Discussed epidural, and patient consents to the procedure:  included risk of possible headache,backache, failed block, allergic reaction, and nerve injury. This patient was asked if she had any questions or concerns before the procedure started.)        Anesthesia Quick Evaluation  

## 2017-05-12 NOTE — Anesthesia Pain Management Evaluation Note (Signed)
  [facility] Pain Management Visit Note  Patient: Sherry Frey, 37 y.o., female  "Hello I am a member of the anesthesia team at [facility]. We have an anesthesia team available at all times to provide care throughout the hospital, including epidural management and anesthesia for C-section. I don't know your plan for the delivery whether it a natural birth, water birth, IV sedation, nitrous supplementation, doula or epidural, but we want to meet your pain goals."   1.Was your pain managed to your expectations on prior hospitalizations?   Yes   2.What is your expectation for pain management during this hospitalization?     Epidural  3.How can we help you reach that goal? Epidural when desired  Record the patient's initial score and the patient's pain goal.   Pain: 3  Pain Goal: 5 [facility] wants you to be able to say your pain was always managed very well.  Sherry Frey 05/12/2017

## 2017-05-12 NOTE — Telephone Encounter (Signed)
Preadmission screen  

## 2017-05-13 LAB — CBC
HEMATOCRIT: 32.1 % — AB (ref 36.0–46.0)
HEMOGLOBIN: 10.6 g/dL — AB (ref 12.0–15.0)
MCH: 26.6 pg (ref 26.0–34.0)
MCHC: 33 g/dL (ref 30.0–36.0)
MCV: 80.7 fL (ref 78.0–100.0)
Platelets: 276 10*3/uL (ref 150–400)
RBC: 3.98 MIL/uL (ref 3.87–5.11)
RDW: 14.1 % (ref 11.5–15.5)
WBC: 6.7 10*3/uL (ref 4.0–10.5)

## 2017-05-13 LAB — RPR: RPR Ser Ql: NONREACTIVE

## 2017-05-13 NOTE — Progress Notes (Signed)
Post Partum Day 1 Subjective: no complaints  Objective: Blood pressure (!) 95/53, pulse 85, temperature 98.2 F (36.8 C), temperature source Axillary, resp. rate 18, height 5' 0.6" (1.539 m), weight 98.9 kg (218 lb), last menstrual period 08/16/2016, SpO2 100 %, unknown if currently breastfeeding.  Physical Exam:  General: alert, cooperative and no distress Lochia: appropriate Uterine Fundus: firm DVT Evaluation: No evidence of DVT seen on physical exam.  Recent Labs    05/12/17 0747 05/13/17 0514  HGB 11.6* 10.6*  HCT 34.8* 32.1*    Assessment/Plan: Plan for discharge tomorrow  Routine postpartum care.    LOS: 1 day   Sherry FelixKULWA,Sherry Guyton WAKURU, MD.  05/13/2017, 4:39 PM

## 2017-05-13 NOTE — Anesthesia Postprocedure Evaluation (Signed)
Anesthesia Post Note  Patient: Sherry Frey  Procedure(s) Performed: AN AD HOC LABOR EPIDURAL     Patient location during evaluation: Mother Baby Anesthesia Type: Epidural Pain management: pain level controlled Vital Signs Assessment: post-procedure vital signs reviewed and stable Respiratory status: spontaneous breathing Cardiovascular status: stable Postop Assessment: patient able to bend at knees and epidural receding Anesthetic complications: no    Last Vitals:  Vitals:   05/12/17 2321 05/13/17 0349  BP: 101/69 91/64  Pulse: 81   Resp: 18 16  Temp: 37.3 C 36.7 C  SpO2:      Last Pain:  Vitals:   05/13/17 0650  TempSrc:   PainSc: 2    Pain Goal: Patients Stated Pain Goal: 2 (05/12/17 2140)               Edison PaceWILKERSON,Christopher Hink

## 2017-05-13 NOTE — Lactation Note (Signed)
This note was copied from a baby's chart. Lactation Consultation Note  Patient Name: Sherry Philis PiqueManhel Kallal EAVWU'JToday's Date: 05/13/2017 Reason for consult: Initial assessment   Initial assessment with Exp BF mom of 18 hour old infant. Infant with 4 BF for 20-30 minutes, 1 BF attempt, formula x 1 of 4 cc, 2 voids and 1 stools since birth. LATCH scores 6-7.  Infant currently asleep in the crib. Mom reports she usually gives formula in the beginning and then the infants usually refuse it at some point.   Enc mom to feed infant STS 8-12 x in 24 hours at first feeding cues. Mom reports infant is often sleepy at the breast.  Enc mom to feed STS, to stimulate infant as needed with feeding and to massage breast with feedings. Mom reports she is able to hand express colostrum easily. Enc mom to offer the breast before offering formula. Reviewed supply and demand and milk coming to volume. Mom voiced understanding.   Enc mom to use good pillow and head support with feeding. She is not experiencing nipple pain but asked what to do if she did, discussed good support, flanging lips as needed, keeping infant close to the breast with feeding and relatching as needed. Reviewed EBM and coconut or olive oil to nipples post BF.   BF Resources handout and LC Brochure given, mom informed of IP/OP Services, BF Support Groups and LC phone #. Mom was given and manual pump for home use, she reports she knows how to use it. Enc mom to call insurance company to ask about a pump if she would like one.   Mom denies need for [facility] assistance at this time. Mom reports all questions/concerns have been answered. Mom to call out for feeding assistance as needed.    Maternal Data Formula Feeding for Exclusion: Yes Reason for exclusion: Mother's choice to formula and breast feed on admission Has patient been taught Hand Expression?: Yes Does the patient have breastfeeding experience prior to this delivery?: Yes  Feeding Feeding Type:  Breast Fed Length of feed: 20 min  LATCH Score Latch: Grasps breast easily, tongue down, lips flanged, rhythmical sucking.  Audible Swallowing: A few with stimulation  Type of Nipple: Flat  Comfort (Breast/Nipple): Soft / non-tender  Hold (Positioning): Assistance needed to correctly position infant at breast and maintain latch.  LATCH Score: 7  Interventions Interventions: Breast feeding basics reviewed;Support pillows;Position options;Expressed milk;Skin to skin;Breast massage;Breast compression;Hand express;Hand pump  Lactation Tools Discussed/Used WIC Program: No   Consult Status Consult Status: Follow-up Date: 05/14/17 Follow-up type: In-patient    Sherry Frey 05/13/2017, 3:01 PM

## 2017-05-14 MED ORDER — IBUPROFEN 600 MG PO TABS: 600.0000 mg | ORAL_TABLET | Freq: Four times a day (QID) | ORAL | 0 refills | Status: AC

## 2017-05-14 NOTE — Lactation Note (Signed)
This note was copied from a baby's chart. Lactation Consultation Note Baby 32 hrs old. Mom's 3rd baby. BF her other 2 for 1 1/2 yrs. The youngest 35 yrs old. Reviewed engorgement, I&O, milk storage, breast filling, engorgement management, supply and demand, supoplementing.  Encouraged to call for questions or concerns. Mom states she has no concerns at this time.   Patient Name: Sherry Philis PiqueManhel Nebel NFAOZ'HToday's Date: 05/14/2017 Reason for consult: Follow-up assessment   Maternal Data    Feeding Feeding Type: Breast Milk with Formula added Length of feed: 30 min  LATCH Score          Comfort (Breast/Nipple): Soft / non-tender        Interventions Interventions: Breast feeding basics reviewed  Lactation Tools Discussed/Used     Consult Status Consult Status: Complete Date: 05/14/17    Charyl DancerCARVER, Mostyn Varnell G 05/14/2017, 5:22 AM

## 2017-05-14 NOTE — Discharge Summary (Signed)
OB Discharge Summary     Patient Name: Sherry Frey DOB: 04/12/1980 MRN: [id number]  Date of admission: 05/12/2017 Delivering MD: Kenney HousemanPROTHERO, NANCY JEAN   Date of discharge: 05/14/2017  Admitting diagnosis: induction Intrauterine pregnancy: 7633w1d     Secondary diagnosis:  Active Problems:   Oligohydramnios in third trimester, not applicable or unspecified fetus   SVD (spontaneous vaginal delivery)  Additional problems: None    Discharge diagnosis: Term Pregnancy Delivered                                                                                                Post partum procedures:None  Augmentation: Cytotec  Complications: None  Hospital course:  Induction of Labor With Vaginal Delivery   37 y.o. yo G3P3003 at 9533w1d was admitted to the hospital 05/12/2017 for induction of labor.  Indication for induction: uncontrolled diabetes.  Patient had an uncomplicated labor course as follows: Membrane Rupture Time/Date: 7:25 PM ,05/12/2017   Intrapartum Procedures: Episiotomy: None [1]                                         Lacerations:  1st degree [2]  Patient had delivery of a Viable infant.  Information for the patient's newborn:  Sara Chuhmed, Girl Amaurie [[id number]][[id number]]  Delivery Method: Vaginal, Spontaneous(Filed from Delivery Summary)   05/12/2017  Details of delivery can be found in separate delivery note.  Patient had a routine postpartum course. Patient is discharged home 05/14/17.  Physical exam  Vitals:   05/13/17 0349 05/13/17 0900 05/13/17 1813 05/14/17 0506  BP: 91/64 (!) 95/53 91/62 109/70  Pulse:  85 79 92  Resp: 16 18 18 16   Temp: 98.1 F (36.7 C) 98.2 F (36.8 C) 97.9 F (36.6 C) 98 F (36.7 C)  TempSrc: Oral Axillary Oral Oral  SpO2:      Weight:      Height:       General: alert, cooperative and no distress Lochia: appropriate Uterine Fundus: firm Incision: N/A DVT Evaluation: No evidence of DVT seen on physical exam. Negative Homan's sign. Labs: Lab  Results  Component Value Date   WBC 6.7 05/13/2017   HGB 10.6 (L) 05/13/2017   HCT 32.1 (L) 05/13/2017   MCV 80.7 05/13/2017   PLT 276 05/13/2017   CMP Latest Ref Rng & Units 10/17/2015  Glucose 65 - 99 mg/dL 91  BUN 6 - 20 mg/dL 7  Creatinine 3.080.44 - 6.571.00 mg/dL 8.460.60  Sodium 962135 - 952145 mmol/L 142  Potassium 3.5 - 5.1 mmol/L 3.9  Chloride 101 - 111 mmol/L 106  CO2 22 - 32 mmol/L -  Calcium 8.9 - 10.3 mg/dL -  Total Protein 6.5 - 8.1 g/dL -  Total Bilirubin 0.3 - 1.2 mg/dL -  Alkaline Phos 38 - 841126 U/L -  AST 15 - 41 U/L -  ALT 14 - 54 U/L -    Discharge instruction: per After Visit Summary and "Baby and Me Booklet".  After visit meds:  PNV, Motrin  Diet: routine diet  Activity: Advance as tolerated. Pelvic rest for 6 weeks.   Outpatient follow up:6 weeks Follow up Appt:No future appointments. Follow up Visit:No Follow-up on file.  Postpartum contraception: IUD Paragard  Newborn Data: Live born female  Birth Weight: 6 lb 13.9 oz (3116 g) APGAR: 9, 9  Newborn Delivery   Birth date/time:  05/12/2017 20:27:00 Delivery type:  Vaginal, Spontaneous     Baby Feeding: Breast Disposition:home with mother   05/14/2017 Kenney Houseman, CNM

## 2017-05-15 ENCOUNTER — Encounter (HOSPITAL_COMMUNITY): Payer: Self-pay

## 2019-04-05 DIAGNOSIS — Z6835 Body mass index (BMI) 35.0-35.9, adult: Secondary | ICD-10-CM | POA: Diagnosis not present

## 2019-04-05 DIAGNOSIS — N6089 Other benign mammary dysplasias of unspecified breast: Secondary | ICD-10-CM | POA: Diagnosis not present

## 2019-04-05 DIAGNOSIS — Z01411 Encounter for gynecological examination (general) (routine) with abnormal findings: Secondary | ICD-10-CM | POA: Diagnosis not present

## 2019-04-05 DIAGNOSIS — N819 Female genital prolapse, unspecified: Secondary | ICD-10-CM | POA: Diagnosis not present
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
## 2019-04-23 DIAGNOSIS — T169XXA Foreign body in ear, unspecified ear, initial encounter: Secondary | ICD-10-CM | POA: Diagnosis not present

## 2019-04-26 DIAGNOSIS — N6082 Other benign mammary dysplasias of left breast: Secondary | ICD-10-CM | POA: Diagnosis not present

## 2019-07-06 DIAGNOSIS — Z3202 Encounter for pregnancy test, result negative: Secondary | ICD-10-CM | POA: Diagnosis not present

## 2019-07-06 DIAGNOSIS — R1031 Right lower quadrant pain: Secondary | ICD-10-CM | POA: Diagnosis not present

## 2019-07-06 DIAGNOSIS — R100 Acute abdomen: Secondary | ICD-10-CM | POA: Diagnosis not present

## 2019-07-06 DIAGNOSIS — E876 Hypokalemia: Secondary | ICD-10-CM | POA: Diagnosis not present

## 2019-07-07 DIAGNOSIS — R1031 Right lower quadrant pain: Secondary | ICD-10-CM | POA: Diagnosis not present

## 2019-07-07 DIAGNOSIS — E876 Hypokalemia: Secondary | ICD-10-CM | POA: Diagnosis not present

## 2019-12-21 DIAGNOSIS — H5340 Unspecified visual field defects: Secondary | ICD-10-CM | POA: Diagnosis not present

## 2019-12-21 DIAGNOSIS — H40023 Open angle with borderline findings, high risk, bilateral: Secondary | ICD-10-CM | POA: Diagnosis not present

## 2019-12-28 DIAGNOSIS — E7800 Pure hypercholesterolemia, unspecified: Secondary | ICD-10-CM | POA: Diagnosis not present

## 2019-12-28 DIAGNOSIS — Z131 Encounter for screening for diabetes mellitus: Secondary | ICD-10-CM | POA: Diagnosis not present

## 2019-12-28 DIAGNOSIS — Z0000 Encounter for general adult medical examination without abnormal findings: Secondary | ICD-10-CM | POA: Diagnosis not present

## 2020-01-28 DIAGNOSIS — H40023 Open angle with borderline findings, high risk, bilateral: Secondary | ICD-10-CM | POA: Diagnosis not present

## 2020-03-31 DIAGNOSIS — Z20822 Contact with and (suspected) exposure to covid-19: Secondary | ICD-10-CM | POA: Diagnosis not present

## 2020-08-01 DIAGNOSIS — N926 Irregular menstruation, unspecified: Secondary | ICD-10-CM | POA: Diagnosis not present

## 2020-08-01 DIAGNOSIS — E282 Polycystic ovarian syndrome: Secondary | ICD-10-CM | POA: Diagnosis not present

## 2020-08-01 DIAGNOSIS — Z6835 Body mass index (BMI) 35.0-35.9, adult: Secondary | ICD-10-CM | POA: Diagnosis not present

## 2020-08-01 DIAGNOSIS — Z30431 Encounter for routine checking of intrauterine contraceptive device: Secondary | ICD-10-CM | POA: Diagnosis not present

## 2020-12-26 DIAGNOSIS — Z6836 Body mass index (BMI) 36.0-36.9, adult: Secondary | ICD-10-CM | POA: Diagnosis not present

## 2020-12-26 DIAGNOSIS — Z124 Encounter for screening for malignant neoplasm of cervix: Secondary | ICD-10-CM | POA: Diagnosis not present

## 2020-12-26 DIAGNOSIS — Z01411 Encounter for gynecological examination (general) (routine) with abnormal findings: Secondary | ICD-10-CM | POA: Diagnosis not present

## 2020-12-26 DIAGNOSIS — E282 Polycystic ovarian syndrome: Secondary | ICD-10-CM | POA: Diagnosis not present

## 2020-12-26 DIAGNOSIS — Z3040 Encounter for surveillance of contraceptives, unspecified: Secondary | ICD-10-CM | POA: Diagnosis not present

## 2021-01-29 DIAGNOSIS — H40023 Open angle with borderline findings, high risk, bilateral: Secondary | ICD-10-CM | POA: Diagnosis not present

## 2021-03-08 DIAGNOSIS — E785 Hyperlipidemia, unspecified: Secondary | ICD-10-CM | POA: Diagnosis not present

## 2021-03-08 DIAGNOSIS — E559 Vitamin D deficiency, unspecified: Secondary | ICD-10-CM | POA: Diagnosis not present

## 2021-03-08 DIAGNOSIS — N951 Menopausal and female climacteric states: Secondary | ICD-10-CM | POA: Diagnosis not present

## 2021-03-08 DIAGNOSIS — R635 Abnormal weight gain: Secondary | ICD-10-CM | POA: Diagnosis not present

## 2021-03-14 DIAGNOSIS — Z6836 Body mass index (BMI) 36.0-36.9, adult: Secondary | ICD-10-CM | POA: Diagnosis not present

## 2021-03-14 DIAGNOSIS — Z1331 Encounter for screening for depression: Secondary | ICD-10-CM | POA: Diagnosis not present

## 2021-03-14 DIAGNOSIS — Z1339 Encounter for screening examination for other mental health and behavioral disorders: Secondary | ICD-10-CM | POA: Diagnosis not present

## 2021-03-14 DIAGNOSIS — N951 Menopausal and female climacteric states: Secondary | ICD-10-CM | POA: Diagnosis not present

## 2021-03-14 DIAGNOSIS — E8881 Metabolic syndrome: Secondary | ICD-10-CM | POA: Diagnosis not present

## 2021-05-04 DIAGNOSIS — Z03818 Encounter for observation for suspected exposure to other biological agents ruled out: Secondary | ICD-10-CM | POA: Diagnosis not present

## 2021-05-04 DIAGNOSIS — J029 Acute pharyngitis, unspecified: Secondary | ICD-10-CM | POA: Diagnosis not present

## 2021-12-12 DIAGNOSIS — Z0000 Encounter for general adult medical examination without abnormal findings: Secondary | ICD-10-CM | POA: Diagnosis not present

## 2021-12-12 DIAGNOSIS — E559 Vitamin D deficiency, unspecified: Secondary | ICD-10-CM | POA: Diagnosis not present

## 2021-12-12 DIAGNOSIS — E538 Deficiency of other specified B group vitamins: Secondary | ICD-10-CM | POA: Diagnosis not present

## 2021-12-12 DIAGNOSIS — E282 Polycystic ovarian syndrome: Secondary | ICD-10-CM | POA: Diagnosis not present

## 2021-12-12 DIAGNOSIS — Z23 Encounter for immunization: Secondary | ICD-10-CM | POA: Diagnosis not present

## 2021-12-12 DIAGNOSIS — E7800 Pure hypercholesterolemia, unspecified: Secondary | ICD-10-CM | POA: Diagnosis not present

## 2021-12-12 DIAGNOSIS — Z130 Encounter for screening for diseases of the blood and blood-forming organs and certain disorders involving the immune mechanism: Secondary | ICD-10-CM | POA: Diagnosis not present

## 2021-12-12 DIAGNOSIS — Z111 Encounter for screening for respiratory tuberculosis: Secondary | ICD-10-CM | POA: Diagnosis not present

## 2021-12-21 DIAGNOSIS — Z23 Encounter for immunization: Secondary | ICD-10-CM | POA: Diagnosis not present

## 2022-01-01 DIAGNOSIS — Z6839 Body mass index (BMI) 39.0-39.9, adult: Secondary | ICD-10-CM | POA: Diagnosis not present

## 2022-01-01 DIAGNOSIS — Z3040 Encounter for surveillance of contraceptives, unspecified: Secondary | ICD-10-CM | POA: Diagnosis not present

## 2022-01-01 DIAGNOSIS — E282 Polycystic ovarian syndrome: Secondary | ICD-10-CM | POA: Diagnosis not present

## 2022-01-01 DIAGNOSIS — N812 Incomplete uterovaginal prolapse: Secondary | ICD-10-CM | POA: Diagnosis not present

## 2022-01-01 DIAGNOSIS — Z9189 Other specified personal risk factors, not elsewhere classified: Secondary | ICD-10-CM | POA: Diagnosis not present

## 2022-01-01 DIAGNOSIS — Z01411 Encounter for gynecological examination (general) (routine) with abnormal findings: Secondary | ICD-10-CM | POA: Diagnosis not present

## 2022-01-01 DIAGNOSIS — R5383 Other fatigue: Secondary | ICD-10-CM | POA: Diagnosis not present

## 2022-01-01 DIAGNOSIS — E8889 Other specified metabolic disorders: Secondary | ICD-10-CM | POA: Diagnosis not present

## 2022-01-01 DIAGNOSIS — E7800 Pure hypercholesterolemia, unspecified: Secondary | ICD-10-CM | POA: Diagnosis not present

## 2022-01-21 DIAGNOSIS — R7303 Prediabetes: Secondary | ICD-10-CM | POA: Diagnosis not present

## 2022-01-21 DIAGNOSIS — E282 Polycystic ovarian syndrome: Secondary | ICD-10-CM | POA: Diagnosis not present

## 2022-02-05 DIAGNOSIS — R7303 Prediabetes: Secondary | ICD-10-CM | POA: Diagnosis not present

## 2022-04-03 DIAGNOSIS — Z6838 Body mass index (BMI) 38.0-38.9, adult: Secondary | ICD-10-CM | POA: Diagnosis not present

## 2022-04-03 DIAGNOSIS — Z9189 Other specified personal risk factors, not elsewhere classified: Secondary | ICD-10-CM | POA: Diagnosis not present

## 2022-04-03 DIAGNOSIS — R7303 Prediabetes: Secondary | ICD-10-CM | POA: Diagnosis not present

## 2022-04-24 DIAGNOSIS — R7303 Prediabetes: Secondary | ICD-10-CM | POA: Diagnosis not present

## 2022-04-25 ENCOUNTER — Other Ambulatory Visit (HOSPITAL_COMMUNITY): Payer: Self-pay

## 2022-04-25 ENCOUNTER — Other Ambulatory Visit: Payer: Self-pay

## 2022-04-25 MED ORDER — WEGOVY 0.25 MG/0.5ML ~~LOC~~ SOAJ
0.2500 mg | SUBCUTANEOUS | 0 refills | Status: AC
  Filled 2022-04-25: qty 2, 28d supply, fill #0

## 2022-04-25 MED ORDER — WEGOVY 0.5 MG/0.5ML ~~LOC~~ SOAJ: 0.5000 mg | SUBCUTANEOUS | 0 refills | Status: AC

## 2022-04-25 MED ORDER — WEGOVY 1 MG/0.5ML ~~LOC~~ SOAJ: 1.0000 mg | SUBCUTANEOUS | 0 refills | Status: AC

## 2022-05-03 ENCOUNTER — Other Ambulatory Visit (HOSPITAL_COMMUNITY): Payer: Self-pay

## 2022-05-14 DIAGNOSIS — R7303 Prediabetes: Secondary | ICD-10-CM | POA: Diagnosis not present

## 2023-02-06 DIAGNOSIS — Z01411 Encounter for gynecological examination (general) (routine) with abnormal findings: Secondary | ICD-10-CM | POA: Diagnosis not present

## 2023-02-06 DIAGNOSIS — E88811 Insulin resistance syndrome, type a: Secondary | ICD-10-CM | POA: Diagnosis not present

## 2023-02-06 DIAGNOSIS — N812 Incomplete uterovaginal prolapse: Secondary | ICD-10-CM | POA: Diagnosis not present

## 2023-02-06 DIAGNOSIS — Z1231 Encounter for screening mammogram for malignant neoplasm of breast: Secondary | ICD-10-CM | POA: Diagnosis not present

## 2023-03-25 DIAGNOSIS — R928 Other abnormal and inconclusive findings on diagnostic imaging of breast: Secondary | ICD-10-CM | POA: Diagnosis not present

## 2023-03-25 DIAGNOSIS — N6313 Unspecified lump in the right breast, lower outer quadrant: Secondary | ICD-10-CM | POA: Diagnosis not present

## 2023-03-26 DIAGNOSIS — N6011 Diffuse cystic mastopathy of right breast: Secondary | ICD-10-CM | POA: Diagnosis not present

## 2023-03-26 DIAGNOSIS — N6012 Diffuse cystic mastopathy of left breast: Secondary | ICD-10-CM | POA: Diagnosis not present

## 2023-04-23 DIAGNOSIS — R011 Cardiac murmur, unspecified: Secondary | ICD-10-CM | POA: Diagnosis not present

## 2023-04-23 DIAGNOSIS — Z1159 Encounter for screening for other viral diseases: Secondary | ICD-10-CM | POA: Diagnosis not present

## 2023-04-23 DIAGNOSIS — Z6838 Body mass index (BMI) 38.0-38.9, adult: Secondary | ICD-10-CM | POA: Diagnosis not present

## 2023-04-23 DIAGNOSIS — E559 Vitamin D deficiency, unspecified: Secondary | ICD-10-CM | POA: Diagnosis not present

## 2023-04-23 DIAGNOSIS — Z23 Encounter for immunization: Secondary | ICD-10-CM | POA: Diagnosis not present

## 2023-04-23 DIAGNOSIS — E11628 Type 2 diabetes mellitus with other skin complications: Secondary | ICD-10-CM | POA: Diagnosis not present

## 2023-04-23 DIAGNOSIS — Z0000 Encounter for general adult medical examination without abnormal findings: Secondary | ICD-10-CM | POA: Diagnosis not present

## 2023-05-12 DIAGNOSIS — R011 Cardiac murmur, unspecified: Secondary | ICD-10-CM | POA: Diagnosis not present

## 2023-06-09 DIAGNOSIS — E559 Vitamin D deficiency, unspecified: Secondary | ICD-10-CM | POA: Diagnosis not present

## 2023-07-24 DIAGNOSIS — E11620 Type 2 diabetes mellitus with diabetic dermatitis: Secondary | ICD-10-CM | POA: Diagnosis not present

## 2023-07-24 DIAGNOSIS — E559 Vitamin D deficiency, unspecified: Secondary | ICD-10-CM | POA: Diagnosis not present

## 2023-07-24 DIAGNOSIS — L83 Acanthosis nigricans: Secondary | ICD-10-CM | POA: Diagnosis not present

## 2023-07-24 DIAGNOSIS — E11628 Type 2 diabetes mellitus with other skin complications: Secondary | ICD-10-CM | POA: Diagnosis not present

## 2023-07-24 DIAGNOSIS — E282 Polycystic ovarian syndrome: Secondary | ICD-10-CM | POA: Diagnosis not present
